# Patient Record
Sex: Female | Born: 1960 | Race: White | Hispanic: No | Marital: Married | Smoking: Never smoker
Health system: Southern US, Community
[De-identification: ages and names within clinical notes are randomized; demographics above are authoritative.]

## PROBLEM LIST (undated history)

## (undated) DIAGNOSIS — J45909 Unspecified asthma, uncomplicated: Secondary | ICD-10-CM

## (undated) DIAGNOSIS — G43909 Migraine, unspecified, not intractable, without status migrainosus: Secondary | ICD-10-CM

## (undated) HISTORY — DX: Unspecified asthma, uncomplicated: J45.909

## (undated) HISTORY — DX: Migraine, unspecified, not intractable, without status migrainosus: G43.909

---

## 2015-12-25 DIAGNOSIS — R5383 Other fatigue: Secondary | ICD-10-CM | POA: Diagnosis not present

## 2015-12-25 DIAGNOSIS — N951 Menopausal and female climacteric states: Secondary | ICD-10-CM | POA: Diagnosis not present

## 2015-12-25 DIAGNOSIS — R5381 Other malaise: Secondary | ICD-10-CM | POA: Diagnosis not present

## 2015-12-25 DIAGNOSIS — R635 Abnormal weight gain: Secondary | ICD-10-CM | POA: Diagnosis not present

## 2015-12-25 DIAGNOSIS — N6001 Solitary cyst of right breast: Secondary | ICD-10-CM | POA: Diagnosis not present

## 2015-12-29 DIAGNOSIS — Z1231 Encounter for screening mammogram for malignant neoplasm of breast: Secondary | ICD-10-CM | POA: Diagnosis not present

## 2016-02-04 DIAGNOSIS — H524 Presbyopia: Secondary | ICD-10-CM | POA: Diagnosis not present

## 2016-02-05 DIAGNOSIS — H5213 Myopia, bilateral: Secondary | ICD-10-CM | POA: Diagnosis not present

## 2016-07-20 DIAGNOSIS — G43709 Chronic migraine without aura, not intractable, without status migrainosus: Secondary | ICD-10-CM | POA: Insufficient documentation

## 2016-07-20 DIAGNOSIS — Z01419 Encounter for gynecological examination (general) (routine) without abnormal findings: Secondary | ICD-10-CM | POA: Diagnosis not present

## 2016-07-20 DIAGNOSIS — Z6823 Body mass index (BMI) 23.0-23.9, adult: Secondary | ICD-10-CM | POA: Diagnosis not present

## 2016-12-15 DIAGNOSIS — R51 Headache: Secondary | ICD-10-CM | POA: Diagnosis not present

## 2016-12-15 DIAGNOSIS — G43719 Chronic migraine without aura, intractable, without status migrainosus: Secondary | ICD-10-CM | POA: Diagnosis not present

## 2016-12-15 DIAGNOSIS — Z049 Encounter for examination and observation for unspecified reason: Secondary | ICD-10-CM | POA: Diagnosis not present

## 2016-12-15 DIAGNOSIS — Z79899 Other long term (current) drug therapy: Secondary | ICD-10-CM | POA: Diagnosis not present

## 2016-12-15 DIAGNOSIS — G43839 Menstrual migraine, intractable, without status migrainosus: Secondary | ICD-10-CM | POA: Diagnosis not present

## 2016-12-28 DIAGNOSIS — M791 Myalgia: Secondary | ICD-10-CM | POA: Diagnosis not present

## 2016-12-28 DIAGNOSIS — G43719 Chronic migraine without aura, intractable, without status migrainosus: Secondary | ICD-10-CM | POA: Diagnosis not present

## 2016-12-28 DIAGNOSIS — G518 Other disorders of facial nerve: Secondary | ICD-10-CM | POA: Diagnosis not present

## 2016-12-28 DIAGNOSIS — G43839 Menstrual migraine, intractable, without status migrainosus: Secondary | ICD-10-CM | POA: Diagnosis not present

## 2016-12-28 DIAGNOSIS — M542 Cervicalgia: Secondary | ICD-10-CM | POA: Diagnosis not present

## 2017-01-11 DIAGNOSIS — G43719 Chronic migraine without aura, intractable, without status migrainosus: Secondary | ICD-10-CM | POA: Diagnosis not present

## 2017-01-11 DIAGNOSIS — G43839 Menstrual migraine, intractable, without status migrainosus: Secondary | ICD-10-CM | POA: Diagnosis not present

## 2017-01-11 DIAGNOSIS — M542 Cervicalgia: Secondary | ICD-10-CM | POA: Diagnosis not present

## 2017-01-11 DIAGNOSIS — G518 Other disorders of facial nerve: Secondary | ICD-10-CM | POA: Diagnosis not present

## 2017-01-11 DIAGNOSIS — M791 Myalgia: Secondary | ICD-10-CM | POA: Diagnosis not present

## 2017-01-28 DIAGNOSIS — G43719 Chronic migraine without aura, intractable, without status migrainosus: Secondary | ICD-10-CM | POA: Diagnosis not present

## 2017-01-28 DIAGNOSIS — G43839 Menstrual migraine, intractable, without status migrainosus: Secondary | ICD-10-CM | POA: Diagnosis not present

## 2017-01-28 DIAGNOSIS — M791 Myalgia: Secondary | ICD-10-CM | POA: Diagnosis not present

## 2017-01-28 DIAGNOSIS — M542 Cervicalgia: Secondary | ICD-10-CM | POA: Diagnosis not present

## 2017-01-28 DIAGNOSIS — G518 Other disorders of facial nerve: Secondary | ICD-10-CM | POA: Diagnosis not present

## 2017-02-11 DIAGNOSIS — H524 Presbyopia: Secondary | ICD-10-CM | POA: Diagnosis not present

## 2017-02-11 DIAGNOSIS — H5213 Myopia, bilateral: Secondary | ICD-10-CM | POA: Diagnosis not present

## 2017-02-14 DIAGNOSIS — M791 Myalgia: Secondary | ICD-10-CM | POA: Diagnosis not present

## 2017-02-14 DIAGNOSIS — G518 Other disorders of facial nerve: Secondary | ICD-10-CM | POA: Diagnosis not present

## 2017-02-14 DIAGNOSIS — G43719 Chronic migraine without aura, intractable, without status migrainosus: Secondary | ICD-10-CM | POA: Diagnosis not present

## 2017-02-14 DIAGNOSIS — M542 Cervicalgia: Secondary | ICD-10-CM | POA: Diagnosis not present

## 2017-02-14 DIAGNOSIS — G43839 Menstrual migraine, intractable, without status migrainosus: Secondary | ICD-10-CM | POA: Diagnosis not present

## 2017-02-28 DIAGNOSIS — M542 Cervicalgia: Secondary | ICD-10-CM | POA: Diagnosis not present

## 2017-02-28 DIAGNOSIS — M791 Myalgia: Secondary | ICD-10-CM | POA: Diagnosis not present

## 2017-02-28 DIAGNOSIS — G43839 Menstrual migraine, intractable, without status migrainosus: Secondary | ICD-10-CM | POA: Diagnosis not present

## 2017-02-28 DIAGNOSIS — G518 Other disorders of facial nerve: Secondary | ICD-10-CM | POA: Diagnosis not present

## 2017-02-28 DIAGNOSIS — G43719 Chronic migraine without aura, intractable, without status migrainosus: Secondary | ICD-10-CM | POA: Diagnosis not present

## 2017-03-14 DIAGNOSIS — G43839 Menstrual migraine, intractable, without status migrainosus: Secondary | ICD-10-CM | POA: Diagnosis not present

## 2017-03-14 DIAGNOSIS — G43719 Chronic migraine without aura, intractable, without status migrainosus: Secondary | ICD-10-CM | POA: Diagnosis not present

## 2017-03-14 DIAGNOSIS — M791 Myalgia, unspecified site: Secondary | ICD-10-CM | POA: Diagnosis not present

## 2017-03-14 DIAGNOSIS — M542 Cervicalgia: Secondary | ICD-10-CM | POA: Diagnosis not present

## 2017-03-14 DIAGNOSIS — G518 Other disorders of facial nerve: Secondary | ICD-10-CM | POA: Diagnosis not present

## 2017-04-26 DIAGNOSIS — M542 Cervicalgia: Secondary | ICD-10-CM | POA: Diagnosis not present

## 2017-04-26 DIAGNOSIS — M791 Myalgia, unspecified site: Secondary | ICD-10-CM | POA: Diagnosis not present

## 2017-04-26 DIAGNOSIS — G43839 Menstrual migraine, intractable, without status migrainosus: Secondary | ICD-10-CM | POA: Diagnosis not present

## 2017-04-26 DIAGNOSIS — G43719 Chronic migraine without aura, intractable, without status migrainosus: Secondary | ICD-10-CM | POA: Diagnosis not present

## 2017-04-26 DIAGNOSIS — G518 Other disorders of facial nerve: Secondary | ICD-10-CM | POA: Diagnosis not present

## 2017-05-19 DIAGNOSIS — Z1231 Encounter for screening mammogram for malignant neoplasm of breast: Secondary | ICD-10-CM | POA: Diagnosis not present

## 2017-06-08 ENCOUNTER — Ambulatory Visit: Payer: BLUE CROSS/BLUE SHIELD | Admitting: Nurse Practitioner

## 2017-06-08 ENCOUNTER — Telehealth: Payer: Self-pay | Admitting: Nurse Practitioner

## 2017-06-08 ENCOUNTER — Encounter: Payer: Self-pay | Admitting: Nurse Practitioner

## 2017-06-08 VITALS — BP 100/80 | HR 55 | Temp 97.9°F | Ht 69.0 in | Wt 144.0 lb

## 2017-06-08 DIAGNOSIS — Z136 Encounter for screening for cardiovascular disorders: Secondary | ICD-10-CM

## 2017-06-08 DIAGNOSIS — Z0000 Encounter for general adult medical examination without abnormal findings: Secondary | ICD-10-CM

## 2017-06-08 DIAGNOSIS — Z13220 Encounter for screening for lipoid disorders: Secondary | ICD-10-CM

## 2017-06-08 DIAGNOSIS — Z1159 Encounter for screening for other viral diseases: Secondary | ICD-10-CM | POA: Diagnosis not present

## 2017-06-08 LAB — CBC
HCT: 40 % (ref 36.0–46.0)
HEMOGLOBIN: 13.1 g/dL (ref 12.0–15.0)
MCHC: 32.7 g/dL (ref 30.0–36.0)
MCV: 96.1 fl (ref 78.0–100.0)
PLATELETS: 163 10*3/uL (ref 150.0–400.0)
RBC: 4.17 Mil/uL (ref 3.87–5.11)
RDW: 14.2 % (ref 11.5–15.5)
WBC: 3.2 10*3/uL — AB (ref 4.0–10.5)

## 2017-06-08 LAB — TSH: TSH: 1.21 u[IU]/mL (ref 0.35–4.50)

## 2017-06-08 LAB — COMPREHENSIVE METABOLIC PANEL
ALK PHOS: 50 U/L (ref 39–117)
ALT: 22 U/L (ref 0–35)
AST: 23 U/L (ref 0–37)
Albumin: 4.2 g/dL (ref 3.5–5.2)
BUN: 24 mg/dL — AB (ref 6–23)
CALCIUM: 9.2 mg/dL (ref 8.4–10.5)
CHLORIDE: 103 meq/L (ref 96–112)
CO2: 28 mEq/L (ref 19–32)
Creatinine, Ser: 0.82 mg/dL (ref 0.40–1.20)
GFR: 75.98 mL/min (ref >60.00)
Glucose, Bld: 94 mg/dL (ref 70–99)
POTASSIUM: 4 meq/L (ref 3.5–5.1)
SODIUM: 139 meq/L (ref 135–145)
Total Bilirubin: 0.5 mg/dL (ref 0.2–1.2)
Total Protein: 7 g/dL (ref 6.0–8.3)

## 2017-06-08 LAB — LIPID PANEL
Cholesterol: 166 mg/dL (ref 0–200)
HDL: 71.1 mg/dL (ref >39.00)
LDL CALC: 68 mg/dL (ref 0–99)
NONHDL: 95.19
TRIGLYCERIDES: 135 mg/dL (ref 0.0–149.0)
Total CHOL/HDL Ratio: 2
VLDL: 27 mg/dL (ref 0.0–40.0)

## 2017-06-08 NOTE — Telephone Encounter (Signed)
Cologuard order  Order [id number] has been submitted

## 2017-06-08 NOTE — Patient Instructions (Addendum)
Will order Cologuard. You will be contacted with instructions.  Sign medical release form to get records from Dr. Lucianne Lei Fletcher's office in [place].  Normal lab results. You will be called once form is completed and signed.  Health Maintenance, Female Adopting a healthy lifestyle and getting preventive care can go a long way to promote health and wellness. Talk with your health care provider about what schedule of regular examinations is right for you. This is a good chance for you to check in with your provider about disease prevention and staying healthy. In between checkups, there are plenty of things you can do on your own. Experts have done a lot of research about which lifestyle changes and preventive measures are most likely to keep you healthy. Ask your health care provider for more information. Weight and diet Eat a healthy diet  Be sure to include plenty of vegetables, fruits, low-fat dairy products, and lean protein.  Do not eat a lot of foods high in solid fats, added sugars, or salt.  Get regular exercise. This is one of the most important things you can do for your health. ? Most adults should exercise for at least 150 minutes each week. The exercise should increase your heart rate and make you sweat (moderate-intensity exercise). ? Most adults should also do strengthening exercises at least twice a week. This is in addition to the moderate-intensity exercise.  Maintain a healthy weight  Body mass index (BMI) is a measurement that can be used to identify possible weight problems. It estimates body fat based on height and weight. Your health care provider can help determine your BMI and help you achieve or maintain a healthy weight.  For females 71 years of age and older: ? A BMI below 18.5 is considered underweight. ? A BMI of 18.5 to 24.9 is normal. ? A BMI of 25 to 29.9 is considered overweight. ? A BMI of 30 and above is considered obese.  Watch levels of  cholesterol and blood lipids  You should start having your blood tested for lipids and cholesterol at 59 years of age, then have this test every 5 years.  You may need to have your cholesterol levels checked more often if: ? Your lipid or cholesterol levels are high. ? You are older than 59 years of age. ? You are at high risk for heart disease.  Cancer screening Lung Cancer  Lung cancer screening is recommended for adults 55-79 years old who are at high risk for lung cancer because of a history of smoking.  A yearly low-dose CT scan of the lungs is recommended for people who: ? Currently smoke. ? Have quit within the past 15 years. ? Have at least a 30-pack-year history of smoking. A pack year is smoking an average of one pack of cigarettes a day for 1 year.  Yearly screening should continue until it has been 15 years since you quit.  Yearly screening should stop if you develop a health problem that would prevent you from having lung cancer treatment.  Breast Cancer  Practice breast self-awareness. This means understanding how your breasts normally appear and feel.  It also means doing regular breast self-exams. Let your health care provider know about any changes, no matter how small.  If you are in your 20s or 30s, you should have a clinical breast exam (CBE) by a health care provider every 1-3 years as part of a regular health exam.  If you are 40 or older,  have a CBE every year. Also consider having a breast X-ray (mammogram) every year.  If you have a family history of breast cancer, talk to your health care provider about genetic screening.  If you are at high risk for breast cancer, talk to your health care provider about having an MRI and a mammogram every year.  Breast cancer gene (BRCA) assessment is recommended for women who have family members with BRCA-related cancers. BRCA-related cancers include: ? Breast. ? Ovarian. ? Tubal. ? Peritoneal cancers.  Results  of the assessment will determine the need for genetic counseling and BRCA1 and BRCA2 testing.  Cervical Cancer Your health care provider may recommend that you be screened regularly for cancer of the pelvic organs (ovaries, uterus, and vagina). This screening involves a pelvic examination, including checking for microscopic changes to the surface of your cervix (Pap test). You may be encouraged to have this screening done every 3 years, beginning at age 72.  For women ages 59-65, health care providers may recommend pelvic exams and Pap testing every 3 years, or they may recommend the Pap and pelvic exam, combined with testing for human papilloma virus (HPV), every 5 years. Some types of HPV increase your risk of cervical cancer. Testing for HPV may also be done on women of any age with unclear Pap test results.  Other health care providers may not recommend any screening for nonpregnant women who are considered low risk for pelvic cancer and who do not have symptoms. Ask your health care provider if a screening pelvic exam is right for you.  If you have had past treatment for cervical cancer or a condition that could lead to cancer, you need Pap tests and screening for cancer for at least 20 years after your treatment. If Pap tests have been discontinued, your risk factors (such as having a new sexual partner) need to be reassessed to determine if screening should resume. Some women have medical problems that increase the chance of getting cervical cancer. In these cases, your health care provider may recommend more frequent screening and Pap tests.  Colorectal Cancer  This type of cancer can be detected and often prevented.  Routine colorectal cancer screening usually begins at 59 years of age and continues through 59 years of age.  Your health care provider may recommend screening at an earlier age if you have risk factors for colon cancer.  Your health care provider may also recommend using  home test kits to check for hidden blood in the stool.  A small camera at the end of a tube can be used to examine your colon directly (sigmoidoscopy or colonoscopy). This is done to check for the earliest forms of colorectal cancer.  Routine screening usually begins at age 71.  Direct examination of the colon should be repeated every 5-10 years through 59 years of age. However, you may need to be screened more often if early forms of precancerous polyps or small growths are found.  Skin Cancer  Check your skin from head to toe regularly.  Tell your health care provider about any new moles or changes in moles, especially if there is a change in a mole's shape or color.  Also tell your health care provider if you have a mole that is larger than the size of a pencil eraser.  Always use sunscreen. Apply sunscreen liberally and repeatedly throughout the day.  Protect yourself by wearing long sleeves, pants, a wide-brimmed hat, and sunglasses whenever you are outside.  Heart disease, diabetes, and high blood pressure  High blood pressure causes heart disease and increases the risk of stroke. High blood pressure is more likely to develop in: ? People who have blood pressure in the high end of the normal range (130-139/85-89 mm Hg). ? People who are overweight or obese. ? People who are African American.  If you are 13-66 years of age, have your blood pressure checked every 3-5 years. If you are 65 years of age or older, have your blood pressure checked every year. You should have your blood pressure measured twice-once when you are at a hospital or clinic, and once when you are not at a hospital or clinic. Record the average of the two measurements. To check your blood pressure when you are not at a hospital or clinic, you can use: ? An automated blood pressure machine at a pharmacy. ? A home blood pressure monitor.  If you are between 3 years and 60 years old, ask your health care provider  if you should take aspirin to prevent strokes.  Have regular diabetes screenings. This involves taking a blood sample to check your fasting blood sugar level. ? If you are at a normal weight and have a low risk for diabetes, have this test once every three years after 59 years of age. ? If you are overweight and have a high risk for diabetes, consider being tested at a younger age or more often. Preventing infection Hepatitis B  If you have a higher risk for hepatitis B, you should be screened for this virus. You are considered at high risk for hepatitis B if: ? You were born in a country where hepatitis B is common. Ask your health care provider which countries are considered high risk. ? Your parents were born in a high-risk country, and you have not been immunized against hepatitis B (hepatitis B vaccine). ? You have HIV or AIDS. ? You use needles to inject street drugs. ? You live with someone who has hepatitis B. ? You have had sex with someone who has hepatitis B. ? You get hemodialysis treatment. ? You take certain medicines for conditions, including cancer, organ transplantation, and autoimmune conditions.  Hepatitis C  Blood testing is recommended for: ? Everyone born from [birth year] through 1967. ? Anyone with known risk factors for hepatitis C.  Sexually transmitted infections (STIs)  You should be screened for sexually transmitted infections (STIs) including gonorrhea and chlamydia if: ? You are sexually active and are younger than 59 years of age. ? You are older than 59 years of age and your health care provider tells you that you are at risk for this type of infection. ? Your sexual activity has changed since you were last screened and you are at an increased risk for chlamydia or gonorrhea. Ask your health care provider if you are at risk.  If you do not have HIV, but are at risk, it may be recommended that you take a prescription medicine daily to prevent HIV infection. This  is called pre-exposure prophylaxis (PrEP). You are considered at risk if: ? You are sexually active and do not regularly use condoms or know the HIV status of your partner(s). ? You take drugs by injection. ? You are sexually active with a partner who has HIV.  Talk with your health care provider about whether you are at high risk of being infected with HIV. If you choose to begin PrEP, you should first be tested for HIV.  You should then be tested every 3 months for as long as you are taking PrEP. Pregnancy  If you are premenopausal and you may become pregnant, ask your health care provider about preconception counseling.  If you may become pregnant, take 400 to 800 micrograms (mcg) of folic acid every day.  If you want to prevent pregnancy, talk to your health care provider about birth control (contraception). Osteoporosis and menopause  Osteoporosis is a disease in which the bones lose minerals and strength with aging. This can result in serious bone fractures. Your risk for osteoporosis can be identified using a bone density scan.  If you are 27 years of age or older, or if you are at risk for osteoporosis and fractures, ask your health care provider if you should be screened.  Ask your health care provider whether you should take a calcium or vitamin D supplement to lower your risk for osteoporosis.  Menopause may have certain physical symptoms and risks.  Hormone replacement therapy may reduce some of these symptoms and risks. Talk to your health care provider about whether hormone replacement therapy is right for you. Follow these instructions at home:  Schedule regular health, dental, and eye exams.  Stay current with your immunizations.  Do not use any tobacco products including cigarettes, chewing tobacco, or electronic cigarettes.  If you are pregnant, do not drink alcohol.  If you are breastfeeding, limit how much and how often you drink alcohol.  Limit alcohol intake  to no more than 1 drink per day for nonpregnant women. One drink equals 12 ounces of beer, 5 ounces of wine, or 1 ounces of hard liquor.  Do not use street drugs.  Do not share needles.  Ask your health care provider for help if you need support or information about quitting drugs.  Tell your health care provider if you often feel depressed.  Tell your health care provider if you have ever been abused or do not feel safe at home. This information is not intended to replace advice given to you by your health care provider. Make sure you discuss any questions you have with your health care provider. Document Released: 12/07/2010 Document Revised: 10/30/2015 Document Reviewed: 02/25/2015 Elsevier Interactive Patient Education  [employer].

## 2017-06-08 NOTE — Progress Notes (Signed)
Subjective:    Patient ID: Nancy Burns, female    DOB: September 08, 1960, 57 y.o.   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 Therefore needs form completed. Reports she is up to date with immunization.  No previous pcp in last 78yrs.  Hx of Migraines: Man[facility] by Dr. Trudie [facility]ler [facility] [place]. Migraines manages through avoiding triggers (food addictive), prn medication (imitrex and baclofen), and trigger injections (last done 6weeks ago).  Immunizations: (TDAP, Hep C screen, Pneumovax, Influenza, zoster)  Health Maintenance  Topic Date Due  . HIV Screening  12/21/1973  . Tetanus Vaccine  12/21/1977  . Pap Smear  12/22/1979  . Mammogram  12/21/2008  . Colon Cancer Screening  12/21/2008  . Flu Shot  02/08/2018*  .  Hepatitis C: One time screening is recommended by Center for Disease Control  (CDC) for  adults born from [birth year] through 1967.   Completed  *Topic was postponed. The date shown is not the original due date.   Diet: heart healthy, high fiber.  Weight:  Wt Readings from Last 3 Encounters:  06/08/17 144 lb (65.3 kg)    Exercise:walking 2-81miles  5days a week.  Fall Risk: Fall Risk  06/08/2017  Falls in the past year? No   Home Safety:home with husband.  Depression/Suicide: Depression screen [facility] 2/9 06/08/2017  Decreased Interest 0  Down, Depressed, Hopeless 0  PHQ - 2 Score 0   No flowsheet data found. Colonoscopy (every 5-49yrs, >50-25yrs):needed. Wants cologuard ordered  Pap Smear (every 79yrs for >21-29 without HPV, every 63yrs for >30-64yrs with HPV):up to date, done by Dr. Rana Snare with [facility], normal per patient. Mammogram (yearly, >54yrs):up to date  Vision: up to date, done by Dr. Hazle Quant  Dental:up to date, done by Dr. Lilian Kapur  Medications and allergies reviewed with patient and updated if appropriate.  There are no active problems to  display for this patient.   Current Outpatient Medications on File Prior to Visit  Medication Sig Dispense Refill  . baclofen (LIORESAL) 10 MG tablet Take 10 mg by mouth 3 (three) times daily.    . SUMAtriptan (IMITREX) 100 MG tablet Take 100 mg by mouth every 2 (two) hours as needed for migraine. May repeat in 2 hours if headache persists or recurs.     No current facility-administered medications on file prior to visit.     Past Medical History:  Diagnosis Date  . Migraines     Social History   Socioeconomic History  . Marital status: Married    Spouse name: None  . Number of children: 2  . Years of education: None  . Highest education level: None  Social Needs  . Financial resource strain: None  . Food insecurity - worry: None  . Food insecurity - inability: None  . Transportation needs - medical: None  . Transportation needs - non-medical: None  Occupational History  . None  Tobacco Use  . Smoking status: Never Smoker  . Smokeless tobacco: Never Used  Substance and Sexual Activity  . Alcohol use: Yes    Comment: social  . Drug use: No  . Sexual activity: Yes  Other Topics Concern  . None  Social History Narrative  . None    Family History  Problem Relation Age of Onset  . Diabetes Father   . Heart disease Father   . Hypertension Father   . Hypertension Brother   .  Heart disease Mother   . Osteoporosis Sister         Review of Systems  Constitutional: Negative for fever, malaise/fatigue and weight loss.  HENT: Negative for congestion and sore throat.   Eyes:       Negative for visual changes  Respiratory: Negative for cough and shortness of breath.   Cardiovascular: Negative for chest pain, palpitations and leg swelling.  Gastrointestinal: Negative for blood in stool, constipation, diarrhea and heartburn.  Genitourinary: Negative for dysuria, frequency and urgency.  Musculoskeletal: Negative for falls, joint pain and myalgias.  Skin: Negative for  rash.  Neurological: Positive for headaches. Negative for dizziness and sensory change.  Endo/Heme/Allergies: Does not bruise/bleed easily.  Psychiatric/Behavioral: Negative for depression, substance abuse and suicidal ideas. The patient is not nervous/anxious.     Objective:   Vitals:   06/08/17 0931  BP: 100/80  Pulse: (!) 55  Temp: 97.9 F (36.6 C)  SpO2: 99%    Body mass index is 21.27 kg/m.   Physical Examination:  Physical Exam  Constitutional: She is oriented to person, place, and time and well-developed, well-nourished, and in no distress. No distress.  HENT:  Right Ear: External ear normal.  Left Ear: External ear normal.  Nose: Nose normal.  Mouth/Throat: Oropharynx is clear and moist. No oropharyngeal exudate.  Eyes: Conjunctivae and EOM are normal. Pupils are equal, round, and reactive to light. No scleral icterus.  Neck: Normal range of motion. Neck supple. No thyromegaly present.  Cardiovascular: Normal rate, normal heart sounds and intact distal pulses.  Pulmonary/Chest: Effort normal and breath sounds normal. She exhibits no tenderness.  Abdominal: Soft. Bowel sounds are normal. She exhibits no distension. There is no tenderness.  Genitourinary:  Genitourinary Comments: Breast and pelvic exam deferred to GYN per patient.  Musculoskeletal: Normal range of motion. She exhibits no edema or tenderness.  Lymphadenopathy:    She has no cervical adenopathy.  Neurological: She is alert and oriented to person, place, and time. Gait normal.  Skin: Skin is warm and dry.  Psychiatric: Affect and judgment normal.    ASSESSMENT and PLAN:  Nancy Burns was seen today for establish care.  Diagnoses and all orders for this visit:  Preventative health care -     Comprehensive metabolic panel -     TSH -     CBC -     Lipid panel -     Cancel: Hepatitis C Antibody -     Hepatitis C Antibody  Encounter for lipid screening for cardiovascular disease -     Lipid  panel  Encounter for hepatitis C screening test for low risk patient -     Cancel: Hepatitis C Antibody -     Hepatitis C Antibody   No problem-specific Assessment & Plan notes found for this encounter.     Follow up: Return if symptoms worsen or fail to improve.  Alysia Pennaharlotte Nche, NP

## 2017-06-09 ENCOUNTER — Encounter: Payer: BLUE CROSS/BLUE SHIELD | Admitting: Behavioral Health

## 2017-06-09 LAB — HEPATITIS C ANTIBODY
Hepatitis C Ab: NONREACTIVE
SIGNAL TO CUT-OFF: 0.01 (ref <1.00)

## 2017-06-13 ENCOUNTER — Encounter: Payer: Self-pay | Admitting: Nurse Practitioner

## 2017-06-14 ENCOUNTER — Ambulatory Visit (INDEPENDENT_AMBULATORY_CARE_PROVIDER_SITE_OTHER): Payer: BLUE CROSS/BLUE SHIELD | Admitting: Behavioral Health

## 2017-06-14 DIAGNOSIS — Z111 Encounter for screening for respiratory tuberculosis: Secondary | ICD-10-CM | POA: Diagnosis not present

## 2017-06-14 NOTE — Progress Notes (Signed)
PPD Placement note/Provider approved PPD skin test Elane FritzJandra Omahoney, 57 y.o. female is here today for placement of PPD test Reason for PPD test: Work Pt taken PPD test before: yes Verified in allergy area and with patient that they are not allergic to the products PPD is made of (Phenol or Tween). Yes Is patient taking any oral or IV steroid medication now or have they taken it in the last month? no Has the patient ever received the BCG vaccine?: no Has the patient been in recent contact with anyone known or suspected of having active TB disease?: no      P:  PPD placed on 06/14/2017. Left forearm/NDC: 45409-811-91/[id number]/Lot: [id number]/Exp: 4.05.2021/Manf: [employer]  Patient advised to return for reading within 48-72 hours.

## 2017-06-15 DIAGNOSIS — G518 Other disorders of facial nerve: Secondary | ICD-10-CM | POA: Diagnosis not present

## 2017-06-15 DIAGNOSIS — M791 Myalgia, unspecified site: Secondary | ICD-10-CM | POA: Diagnosis not present

## 2017-06-15 DIAGNOSIS — G43839 Menstrual migraine, intractable, without status migrainosus: Secondary | ICD-10-CM | POA: Diagnosis not present

## 2017-06-15 DIAGNOSIS — G43719 Chronic migraine without aura, intractable, without status migrainosus: Secondary | ICD-10-CM | POA: Diagnosis not present

## 2017-06-15 DIAGNOSIS — M542 Cervicalgia: Secondary | ICD-10-CM | POA: Diagnosis not present

## 2017-06-16 ENCOUNTER — Ambulatory Visit: Payer: BLUE CROSS/BLUE SHIELD | Admitting: Behavioral Health

## 2017-06-16 DIAGNOSIS — Z111 Encounter for screening for respiratory tuberculosis: Secondary | ICD-10-CM

## 2017-06-16 LAB — TB SKIN TEST
INDURATION: 0 mm
TB Skin Test: NEGATIVE

## 2017-06-17 NOTE — Progress Notes (Signed)
Patient came in clinic for PPD reading within 48-72 hours of placement. PPD was placed on 06/14/17; see clinical support note. Results were negative; 0 mm induration. No chest x-ray was required. Completed form & letter given to patient for employment.

## 2017-06-20 DIAGNOSIS — Z1212 Encounter for screening for malignant neoplasm of rectum: Secondary | ICD-10-CM | POA: Diagnosis not present

## 2017-06-20 DIAGNOSIS — Z1211 Encounter for screening for malignant neoplasm of colon: Secondary | ICD-10-CM | POA: Diagnosis not present

## 2017-06-21 LAB — COLOGUARD: Cologuard: NEGATIVE

## 2017-07-08 DIAGNOSIS — N6320 Unspecified lump in the left breast, unspecified quadrant: Secondary | ICD-10-CM | POA: Diagnosis not present

## 2017-07-12 ENCOUNTER — Other Ambulatory Visit: Payer: Self-pay | Admitting: Obstetrics and Gynecology

## 2017-07-12 DIAGNOSIS — N6320 Unspecified lump in the left breast, unspecified quadrant: Secondary | ICD-10-CM

## 2017-07-19 ENCOUNTER — Telehealth: Payer: Self-pay | Admitting: Family Medicine

## 2017-07-19 ENCOUNTER — Ambulatory Visit
Admission: RE | Admit: 2017-07-19 | Discharge: 2017-07-19 | Disposition: A | Discharge status: Home / Self Care | Payer: BLUE CROSS/BLUE SHIELD | Source: Ambulatory Visit | Attending: Obstetrics and Gynecology | Admitting: Obstetrics and Gynecology

## 2017-07-19 ENCOUNTER — Other Ambulatory Visit: Payer: Self-pay | Admitting: Obstetrics and Gynecology

## 2017-07-19 DIAGNOSIS — N6320 Unspecified lump in the left breast, unspecified quadrant: Secondary | ICD-10-CM

## 2017-07-19 DIAGNOSIS — N6489 Other specified disorders of breast: Secondary | ICD-10-CM | POA: Diagnosis not present

## 2017-07-19 DIAGNOSIS — R922 Inconclusive mammogram: Secondary | ICD-10-CM | POA: Diagnosis not present

## 2017-07-19 NOTE — Telephone Encounter (Signed)
Copied from CRM [phone]. Topic: Quick Communication - See Telephone Encounter >> Jul 19, 2017  4:15 PM Cipriano BunkerLambe, Annette S wrote:  CRM for notification. See Telephone encounter for:   Pt. Called spoke to the people from [employer] and they told her results were sent to doctors portal.   Please cal with results  07/19/17.

## 2017-07-20 NOTE — Telephone Encounter (Signed)
I faxed the company requesting a copy to be faxed to us ASAP as we have yet to see results and patient was advised that they were in/thx dmf

## 2017-07-26 ENCOUNTER — Encounter: Payer: Self-pay | Admitting: Nurse Practitioner

## 2017-07-26 NOTE — Progress Notes (Signed)
Abstracted result and sent to scan  

## 2017-07-27 DIAGNOSIS — G518 Other disorders of facial nerve: Secondary | ICD-10-CM | POA: Diagnosis not present

## 2017-07-27 DIAGNOSIS — M791 Myalgia, unspecified site: Secondary | ICD-10-CM | POA: Diagnosis not present

## 2017-07-27 DIAGNOSIS — M542 Cervicalgia: Secondary | ICD-10-CM | POA: Diagnosis not present

## 2017-07-27 DIAGNOSIS — G43719 Chronic migraine without aura, intractable, without status migrainosus: Secondary | ICD-10-CM | POA: Diagnosis not present

## 2017-07-27 DIAGNOSIS — G43839 Menstrual migraine, intractable, without status migrainosus: Secondary | ICD-10-CM | POA: Diagnosis not present

## 2017-07-27 NOTE — Telephone Encounter (Signed)
Left detail massage inform pt of the result: negative.

## 2017-08-08 DIAGNOSIS — L570 Actinic keratosis: Secondary | ICD-10-CM | POA: Diagnosis not present

## 2017-08-08 DIAGNOSIS — D1801 Hemangioma of skin and subcutaneous tissue: Secondary | ICD-10-CM | POA: Diagnosis not present

## 2017-08-08 DIAGNOSIS — L814 Other melanin hyperpigmentation: Secondary | ICD-10-CM | POA: Diagnosis not present

## 2017-08-08 DIAGNOSIS — L821 Other seborrheic keratosis: Secondary | ICD-10-CM | POA: Diagnosis not present

## 2017-08-10 DIAGNOSIS — N6459 Other signs and symptoms in breast: Secondary | ICD-10-CM | POA: Diagnosis not present

## 2017-09-05 DIAGNOSIS — Z01419 Encounter for gynecological examination (general) (routine) without abnormal findings: Secondary | ICD-10-CM | POA: Diagnosis not present

## 2017-09-05 DIAGNOSIS — Z6821 Body mass index (BMI) 21.0-21.9, adult: Secondary | ICD-10-CM | POA: Diagnosis not present

## 2017-09-07 DIAGNOSIS — M791 Myalgia, unspecified site: Secondary | ICD-10-CM | POA: Diagnosis not present

## 2017-09-07 DIAGNOSIS — G43719 Chronic migraine without aura, intractable, without status migrainosus: Secondary | ICD-10-CM | POA: Diagnosis not present

## 2017-09-07 DIAGNOSIS — G518 Other disorders of facial nerve: Secondary | ICD-10-CM | POA: Diagnosis not present

## 2017-09-07 DIAGNOSIS — G43839 Menstrual migraine, intractable, without status migrainosus: Secondary | ICD-10-CM | POA: Diagnosis not present

## 2017-09-07 DIAGNOSIS — M542 Cervicalgia: Secondary | ICD-10-CM | POA: Diagnosis not present

## 2017-09-19 DIAGNOSIS — N6320 Unspecified lump in the left breast, unspecified quadrant: Secondary | ICD-10-CM | POA: Diagnosis not present

## 2017-10-10 DIAGNOSIS — G43839 Menstrual migraine, intractable, without status migrainosus: Secondary | ICD-10-CM | POA: Diagnosis not present

## 2017-10-10 DIAGNOSIS — M542 Cervicalgia: Secondary | ICD-10-CM | POA: Diagnosis not present

## 2017-10-10 DIAGNOSIS — G509 Disorder of trigeminal nerve, unspecified: Secondary | ICD-10-CM | POA: Diagnosis not present

## 2017-10-10 DIAGNOSIS — G500 Trigeminal neuralgia: Secondary | ICD-10-CM | POA: Diagnosis not present

## 2017-10-10 DIAGNOSIS — G518 Other disorders of facial nerve: Secondary | ICD-10-CM | POA: Diagnosis not present

## 2017-10-10 DIAGNOSIS — M791 Myalgia, unspecified site: Secondary | ICD-10-CM | POA: Diagnosis not present

## 2017-10-10 DIAGNOSIS — G43719 Chronic migraine without aura, intractable, without status migrainosus: Secondary | ICD-10-CM | POA: Diagnosis not present

## 2017-10-19 ENCOUNTER — Ambulatory Visit: Payer: BLUE CROSS/BLUE SHIELD | Admitting: Nurse Practitioner

## 2017-10-19 VITALS — BP 98/68 | HR 51 | Temp 97.5°F | Ht 69.0 in | Wt 140.6 lb

## 2017-10-19 DIAGNOSIS — S30860A Insect bite (nonvenomous) of lower back and pelvis, initial encounter: Secondary | ICD-10-CM

## 2017-10-19 DIAGNOSIS — W57XXXA Bitten or stung by nonvenomous insect and other nonvenomous arthropods, initial encounter: Secondary | ICD-10-CM | POA: Diagnosis not present

## 2017-10-19 NOTE — Progress Notes (Signed)
Subjective:  Patient ID: Nancy Burns, female    DOB: 04/24/1961  Age: 57 y.o. MRN: [id number]  CC: Tick Removal (4.28.19  found on 4.30.19 on lower left back. little itch and redness. unaware of any other symptoms, just want to get spiot checked.)  Rash  This is a new problem. The current episode started 1 to 4 weeks ago. The problem is unchanged. The affected locations include the back. The rash is characterized by pain, itchiness, redness and swelling. She was exposed to an insect bite/sting (tick bite 10/02/17, removed 10/04/17). Pertinent negatives include no anorexia, congestion, diarrhea, facial edema, fatigue, fever, joint pain or shortness of breath. Past treatments include anti-itch cream and antibiotic cream. The treatment provided no relief.   Does not want to take oral abx unless she has to. Will like lyme and RMSF test done.  Outpatient Medications Prior to Visit  Medication Sig Dispense Refill  . baclofen (LIORESAL) 10 MG tablet Take 10 mg by mouth 3 (three) times daily.    Marland Kitchen MAGNESIUM PO Take by mouth.    . SUMAtriptan (IMITREX) 100 MG tablet Take 100 mg by mouth every 2 (two) hours as needed for migraine. May repeat in 2 hours if headache persists or recurs.     No facility-administered medications prior to visit.     ROS See HPI  Objective:  BP 98/68 (BP Location: Left Arm, Patient Position: Sitting, Cuff Size: Normal)   Pulse (!) 51   Temp (!) 97.5 F (36.4 C) (Oral)   Ht  (1.753 m)   Wt 140 lb 9.6 oz (63.8 kg)   SpO2 96%   BMI 20.76 kg/m   BP Readings from Last 3 Encounters:  10/19/17 98/68  06/08/17 100/80    Wt Readings from Last 3 Encounters:  10/19/17 140 lb 9.6 oz (63.8 kg)  06/08/17 144 lb (65.3 kg)    Physical Exam  Constitutional: No distress.  Cardiovascular: Normal rate.  Pulmonary/Chest: Effort normal.  Skin: Skin is warm. Rash noted. Rash is papular. There is erythema.     No tick remnants  Psychiatric: She has a normal mood  and affect. Her behavior is normal.  Vitals reviewed.   Lab Results  Component Value Date   WBC 3.2 (L) 06/08/2017   HGB 13.1 06/08/2017   HCT 40.0 06/08/2017   PLT 163.0 06/08/2017   GLUCOSE 94 06/08/2017   CHOL 166 06/08/2017   TRIG 135.0 06/08/2017   HDL 71.10 06/08/2017   LDLCALC 68 06/08/2017   ALT 22 06/08/2017   AST 23 06/08/2017   NA 139 06/08/2017   K 4.0 06/08/2017   CL 103 06/08/2017   CREATININE 0.82 06/08/2017   BUN 24 (H) 06/08/2017   CO2 28 06/08/2017   TSH 1.21 06/08/2017    US Breast Ltd Uni Left Inc Axilla  Result Date: 07/19/2017 CLINICAL DATA:  57 year old female with a palpable left breast abnormality for many years. Patient had an episode of associated pain and tenderness to this region approximately 2 weeks ago with associated swelling of left axillary lymph nodes and left arm tingling. Symptoms have since improved. EXAM: 2D DIGITAL DIAGNOSTIC LEFT MAMMOGRAM WITH CAD AND ADJUNCT TOMO ULTRASOUND LEFT BREAST COMPARISON:  Previous exam(s). ACR Breast Density Category d: The breast tissue is extremely dense, which lowers the sensitivity of mammography. FINDINGS: An oval, circumscribed isodense mass is seen deep to the radiopaque BB at the site of the patient's palpable abnormality on tangential views. No suspicious mammographic findings are  identified within the left axilla. Mammographic images were processed with CAD. On physical exam, I palpate a smooth circumscribed 1 mm mobile mass within the inferior left breast. Targeted ultrasound is performed, showing an oval, circumscribed hypoechoic mass at the 5 o'clock position 4 cm from the nipple. It measures 1 x 0.9 x 0.4 cm. Note is made of internal and peripheral vascularity. This corresponds with the patient's stable, palpable abnormality. A similar appearing 0.5 x 0.5 x 0.4 cm mass is noted at the 5 o'clock position 1 cm from the nipple. IMPRESSION: 1. 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 Patient has felt the larger of these areas for many years, without significant change in size. Recommendation is for six-month ultrasound follow-up of both masses to document imaging stability. No suspicious mammographic or sonographic findings are identified, corresponding with the patient's improving tenderness in this region. 2. No suspicious left axillary findings corresponding with the patient's clinical symptoms. RECOMMENDATION: Left breast ultrasound in 6 months. I have discussed the findings and recommendations with the patient. Results were also provided in writing at the conclusion of the visit. If applicable, a reminder letter will be sent to the patient regarding the next appointment. BI-RADS CATEGORY  3: Probably benign. Electronically Signed   By: Sande Brothers M.D.   On: 07/19/2017 11:51   Mm Diag Breast Tomo Uni Left  Result Date: 07/19/2017 CLINICAL DATA:  57 year old female with a palpable left breast abnormality for many years. Patient had an episode of associated pain and tenderness to this region approximately 2 weeks ago with associated swelling of left axillary lymph nodes and left arm tingling. Symptoms have since improved. EXAM: 2D DIGITAL DIAGNOSTIC LEFT MAMMOGRAM WITH CAD AND ADJUNCT TOMO ULTRASOUND LEFT BREAST COMPARISON:  Previous exam(s). ACR Breast Density Category d: The breast tissue is extremely dense, which lowers the sensitivity of mammography. FINDINGS: An oval, circumscribed isodense mass is seen deep to the radiopaque BB at the site of the patient's palpable abnormality on tangential views. No suspicious mammographic findings are identified within the left axilla. Mammographic images were processed with CAD. On physical exam, I palpate a smooth circumscribed 1 mm mobile mass within the inferior left breast. Targeted ultrasound is performed, showing an oval, circumscribed hypoechoic mass at the 5 o'clock position 4 cm from the nipple. It measures 1 x 0.9 x 0.4  cm. Note is made of internal and peripheral vascularity. This corresponds with the patient's stable, palpable abnormality. A similar appearing 0.5 x 0.5 x 0.4 cm mass is noted at the 5 o'clock position 1 cm from the nipple. IMPRESSION: 1. 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 Patient has felt the larger of these areas for many years, without significant change in size. Recommendation is for six-month ultrasound follow-up of both masses to document imaging stability. No suspicious mammographic or sonographic findings are identified, corresponding with the patient's improving tenderness in this region. 2. No suspicious left axillary findings corresponding with the patient's clinical symptoms. RECOMMENDATION: Left breast ultrasound in 6 months. I have discussed the findings and recommendations with the patient. Results were also provided in writing at the conclusion of the visit. If applicable, a reminder letter will be sent to the patient regarding the next appointment. BI-RADS CATEGORY  3: Probably benign. Electronically Signed   By: Sande Brothers M.D.   On: 07/19/2017 11:51    Assessment & Plan:   Macel was seen today for tick removal.  Diagnoses and all orders  for this visit:  Tick bite of lower back, initial encounter -     Lyme Disease, IgM, Early Test w/ Rflx -     Rocky mtn spotted fvr ab, IgM-blood   I am having Nancy Burns maintain her baclofen, SUMAtriptan, and MAGNESIUM PO.  No orders of the defined types were placed in this encounter.   Follow-up: No follow-ups on file.  Alysia Penna, NP

## 2017-10-19 NOTE — Patient Instructions (Signed)

## 2017-10-20 ENCOUNTER — Encounter: Payer: Self-pay | Admitting: Nurse Practitioner

## 2017-10-21 LAB — LYME, IGM, EARLY TEST/REFLEX

## 2017-10-21 LAB — ROCKY MTN SPOTTED FVR AB, IGM-BLOOD: RMSF IGM: 0.29 {index} (ref 0.00–0.89)

## 2017-12-01 DIAGNOSIS — M791 Myalgia, unspecified site: Secondary | ICD-10-CM | POA: Diagnosis not present

## 2017-12-01 DIAGNOSIS — M542 Cervicalgia: Secondary | ICD-10-CM | POA: Diagnosis not present

## 2017-12-01 DIAGNOSIS — G43839 Menstrual migraine, intractable, without status migrainosus: Secondary | ICD-10-CM | POA: Diagnosis not present

## 2017-12-01 DIAGNOSIS — G518 Other disorders of facial nerve: Secondary | ICD-10-CM | POA: Diagnosis not present

## 2017-12-01 DIAGNOSIS — G43719 Chronic migraine without aura, intractable, without status migrainosus: Secondary | ICD-10-CM | POA: Diagnosis not present

## 2018-01-06 DIAGNOSIS — G43719 Chronic migraine without aura, intractable, without status migrainosus: Secondary | ICD-10-CM | POA: Diagnosis not present

## 2018-01-06 DIAGNOSIS — G43839 Menstrual migraine, intractable, without status migrainosus: Secondary | ICD-10-CM | POA: Diagnosis not present

## 2018-01-06 DIAGNOSIS — G518 Other disorders of facial nerve: Secondary | ICD-10-CM | POA: Diagnosis not present

## 2018-01-06 DIAGNOSIS — M542 Cervicalgia: Secondary | ICD-10-CM | POA: Diagnosis not present

## 2018-01-06 DIAGNOSIS — M791 Myalgia, unspecified site: Secondary | ICD-10-CM | POA: Diagnosis not present

## 2018-01-18 ENCOUNTER — Other Ambulatory Visit: Payer: Self-pay | Admitting: Obstetrics and Gynecology

## 2018-01-18 ENCOUNTER — Ambulatory Visit
Admission: RE | Admit: 2018-01-18 | Discharge: 2018-01-18 | Disposition: A | Discharge status: Home / Self Care | Payer: BLUE CROSS/BLUE SHIELD | Source: Ambulatory Visit | Attending: Obstetrics and Gynecology | Admitting: Obstetrics and Gynecology

## 2018-01-18 DIAGNOSIS — N6320 Unspecified lump in the left breast, unspecified quadrant: Secondary | ICD-10-CM

## 2018-01-18 DIAGNOSIS — D242 Benign neoplasm of left breast: Secondary | ICD-10-CM | POA: Diagnosis not present

## 2018-01-18 DIAGNOSIS — N6489 Other specified disorders of breast: Secondary | ICD-10-CM | POA: Diagnosis not present

## 2018-01-25 DIAGNOSIS — G43719 Chronic migraine without aura, intractable, without status migrainosus: Secondary | ICD-10-CM | POA: Diagnosis not present

## 2018-01-25 DIAGNOSIS — G518 Other disorders of facial nerve: Secondary | ICD-10-CM | POA: Diagnosis not present

## 2018-01-25 DIAGNOSIS — G43839 Menstrual migraine, intractable, without status migrainosus: Secondary | ICD-10-CM | POA: Diagnosis not present

## 2018-01-25 DIAGNOSIS — M791 Myalgia, unspecified site: Secondary | ICD-10-CM | POA: Diagnosis not present

## 2018-01-25 DIAGNOSIS — M542 Cervicalgia: Secondary | ICD-10-CM | POA: Diagnosis not present

## 2018-02-09 ENCOUNTER — Other Ambulatory Visit: Payer: Self-pay | Admitting: Internal Medicine

## 2018-02-15 DIAGNOSIS — H52223 Regular astigmatism, bilateral: Secondary | ICD-10-CM | POA: Diagnosis not present

## 2018-02-22 DIAGNOSIS — G518 Other disorders of facial nerve: Secondary | ICD-10-CM | POA: Diagnosis not present

## 2018-02-22 DIAGNOSIS — M791 Myalgia, unspecified site: Secondary | ICD-10-CM | POA: Diagnosis not present

## 2018-02-22 DIAGNOSIS — G43839 Menstrual migraine, intractable, without status migrainosus: Secondary | ICD-10-CM | POA: Diagnosis not present

## 2018-02-22 DIAGNOSIS — M542 Cervicalgia: Secondary | ICD-10-CM | POA: Diagnosis not present

## 2018-02-22 DIAGNOSIS — G43719 Chronic migraine without aura, intractable, without status migrainosus: Secondary | ICD-10-CM | POA: Diagnosis not present

## 2018-03-23 DIAGNOSIS — M542 Cervicalgia: Secondary | ICD-10-CM | POA: Diagnosis not present

## 2018-03-23 DIAGNOSIS — G518 Other disorders of facial nerve: Secondary | ICD-10-CM | POA: Diagnosis not present

## 2018-03-23 DIAGNOSIS — G43839 Menstrual migraine, intractable, without status migrainosus: Secondary | ICD-10-CM | POA: Diagnosis not present

## 2018-03-23 DIAGNOSIS — G43719 Chronic migraine without aura, intractable, without status migrainosus: Secondary | ICD-10-CM | POA: Diagnosis not present

## 2018-03-23 DIAGNOSIS — M791 Myalgia, unspecified site: Secondary | ICD-10-CM | POA: Diagnosis not present

## 2018-03-29 DIAGNOSIS — H5213 Myopia, bilateral: Secondary | ICD-10-CM | POA: Diagnosis not present

## 2018-04-18 DIAGNOSIS — G43839 Menstrual migraine, intractable, without status migrainosus: Secondary | ICD-10-CM | POA: Diagnosis not present

## 2018-04-18 DIAGNOSIS — G43719 Chronic migraine without aura, intractable, without status migrainosus: Secondary | ICD-10-CM | POA: Diagnosis not present

## 2018-04-18 DIAGNOSIS — M791 Myalgia, unspecified site: Secondary | ICD-10-CM | POA: Diagnosis not present

## 2018-04-18 DIAGNOSIS — G518 Other disorders of facial nerve: Secondary | ICD-10-CM | POA: Diagnosis not present

## 2018-04-18 DIAGNOSIS — M542 Cervicalgia: Secondary | ICD-10-CM | POA: Diagnosis not present

## 2018-05-02 DIAGNOSIS — G43839 Menstrual migraine, intractable, without status migrainosus: Secondary | ICD-10-CM | POA: Diagnosis not present

## 2018-05-02 DIAGNOSIS — M542 Cervicalgia: Secondary | ICD-10-CM | POA: Diagnosis not present

## 2018-05-02 DIAGNOSIS — G43719 Chronic migraine without aura, intractable, without status migrainosus: Secondary | ICD-10-CM | POA: Diagnosis not present

## 2018-05-02 DIAGNOSIS — G518 Other disorders of facial nerve: Secondary | ICD-10-CM | POA: Diagnosis not present

## 2018-05-02 DIAGNOSIS — M791 Myalgia, unspecified site: Secondary | ICD-10-CM | POA: Diagnosis not present

## 2018-05-10 DIAGNOSIS — J069 Acute upper respiratory infection, unspecified: Secondary | ICD-10-CM | POA: Diagnosis not present

## 2018-05-12 DIAGNOSIS — J208 Acute bronchitis due to other specified organisms: Secondary | ICD-10-CM | POA: Diagnosis not present

## 2018-05-12 DIAGNOSIS — B9689 Other specified bacterial agents as the cause of diseases classified elsewhere: Secondary | ICD-10-CM | POA: Diagnosis not present

## 2018-05-25 DIAGNOSIS — G43719 Chronic migraine without aura, intractable, without status migrainosus: Secondary | ICD-10-CM | POA: Diagnosis not present

## 2018-05-25 DIAGNOSIS — M791 Myalgia, unspecified site: Secondary | ICD-10-CM | POA: Diagnosis not present

## 2018-05-25 DIAGNOSIS — M542 Cervicalgia: Secondary | ICD-10-CM | POA: Diagnosis not present

## 2018-05-25 DIAGNOSIS — G518 Other disorders of facial nerve: Secondary | ICD-10-CM | POA: Diagnosis not present

## 2018-05-25 DIAGNOSIS — G43839 Menstrual migraine, intractable, without status migrainosus: Secondary | ICD-10-CM | POA: Diagnosis not present

## 2018-05-29 ENCOUNTER — Ambulatory Visit
Admission: RE | Admit: 2018-05-29 | Discharge: 2018-05-29 | Disposition: A | Discharge status: Home / Self Care | Payer: BLUE CROSS/BLUE SHIELD | Source: Ambulatory Visit | Attending: Obstetrics and Gynecology | Admitting: Obstetrics and Gynecology

## 2018-05-29 DIAGNOSIS — N6320 Unspecified lump in the left breast, unspecified quadrant: Secondary | ICD-10-CM

## 2018-05-29 DIAGNOSIS — N6323 Unspecified lump in the left breast, lower outer quadrant: Secondary | ICD-10-CM | POA: Diagnosis not present

## 2018-05-29 DIAGNOSIS — R922 Inconclusive mammogram: Secondary | ICD-10-CM | POA: Diagnosis not present

## 2018-07-05 DIAGNOSIS — M791 Myalgia, unspecified site: Secondary | ICD-10-CM | POA: Diagnosis not present

## 2018-07-05 DIAGNOSIS — M542 Cervicalgia: Secondary | ICD-10-CM | POA: Diagnosis not present

## 2018-07-05 DIAGNOSIS — G43719 Chronic migraine without aura, intractable, without status migrainosus: Secondary | ICD-10-CM | POA: Diagnosis not present

## 2018-07-05 DIAGNOSIS — G518 Other disorders of facial nerve: Secondary | ICD-10-CM | POA: Diagnosis not present

## 2018-07-05 DIAGNOSIS — G43839 Menstrual migraine, intractable, without status migrainosus: Secondary | ICD-10-CM | POA: Diagnosis not present

## 2018-07-19 DIAGNOSIS — G43839 Menstrual migraine, intractable, without status migrainosus: Secondary | ICD-10-CM | POA: Diagnosis not present

## 2018-07-19 DIAGNOSIS — M791 Myalgia, unspecified site: Secondary | ICD-10-CM | POA: Diagnosis not present

## 2018-07-19 DIAGNOSIS — M542 Cervicalgia: Secondary | ICD-10-CM | POA: Diagnosis not present

## 2018-07-19 DIAGNOSIS — G43719 Chronic migraine without aura, intractable, without status migrainosus: Secondary | ICD-10-CM | POA: Diagnosis not present

## 2018-07-19 DIAGNOSIS — G518 Other disorders of facial nerve: Secondary | ICD-10-CM | POA: Diagnosis not present

## 2018-08-24 DIAGNOSIS — G43719 Chronic migraine without aura, intractable, without status migrainosus: Secondary | ICD-10-CM | POA: Diagnosis not present

## 2018-08-24 DIAGNOSIS — G43839 Menstrual migraine, intractable, without status migrainosus: Secondary | ICD-10-CM | POA: Diagnosis not present

## 2018-09-22 ENCOUNTER — Telehealth: Payer: Self-pay | Admitting: Nurse Practitioner

## 2018-09-22 NOTE — Telephone Encounter (Signed)
I called patient to schedule appointment for CPE per [facility] request. Patient stated that she will call back at a later time to schedule.

## 2018-11-30 DIAGNOSIS — Z6821 Body mass index (BMI) 21.0-21.9, adult: Secondary | ICD-10-CM | POA: Diagnosis not present

## 2018-11-30 DIAGNOSIS — Z01419 Encounter for gynecological examination (general) (routine) without abnormal findings: Secondary | ICD-10-CM | POA: Diagnosis not present

## 2019-01-08 DIAGNOSIS — Z872 Personal history of diseases of the skin and subcutaneous tissue: Secondary | ICD-10-CM | POA: Diagnosis not present

## 2019-01-08 DIAGNOSIS — L570 Actinic keratosis: Secondary | ICD-10-CM | POA: Diagnosis not present

## 2019-01-08 DIAGNOSIS — D225 Melanocytic nevi of trunk: Secondary | ICD-10-CM | POA: Diagnosis not present

## 2019-01-17 ENCOUNTER — Ambulatory Visit: Payer: BLUE CROSS/BLUE SHIELD | Admitting: Nurse Practitioner

## 2019-02-19 DIAGNOSIS — M542 Cervicalgia: Secondary | ICD-10-CM | POA: Diagnosis not present

## 2019-02-19 DIAGNOSIS — G43839 Menstrual migraine, intractable, without status migrainosus: Secondary | ICD-10-CM | POA: Diagnosis not present

## 2019-02-19 DIAGNOSIS — G43719 Chronic migraine without aura, intractable, without status migrainosus: Secondary | ICD-10-CM | POA: Diagnosis not present

## 2019-02-21 ENCOUNTER — Ambulatory Visit: Payer: BLUE CROSS/BLUE SHIELD | Admitting: Nurse Practitioner

## 2019-02-26 ENCOUNTER — Other Ambulatory Visit: Payer: Self-pay

## 2019-02-26 ENCOUNTER — Ambulatory Visit (INDEPENDENT_AMBULATORY_CARE_PROVIDER_SITE_OTHER): Payer: BC Managed Care – PPO | Admitting: Nurse Practitioner

## 2019-02-26 ENCOUNTER — Encounter: Payer: Self-pay | Admitting: Nurse Practitioner

## 2019-02-26 VITALS — BP 92/70 | HR 58 | Temp 97.7°F | Ht 69.0 in | Wt 141.8 lb

## 2019-02-26 DIAGNOSIS — Z23 Encounter for immunization: Secondary | ICD-10-CM | POA: Diagnosis not present

## 2019-02-26 DIAGNOSIS — Z13220 Encounter for screening for lipoid disorders: Secondary | ICD-10-CM | POA: Diagnosis not present

## 2019-02-26 DIAGNOSIS — Z136 Encounter for screening for cardiovascular disorders: Secondary | ICD-10-CM | POA: Diagnosis not present

## 2019-02-26 DIAGNOSIS — Z0000 Encounter for general adult medical examination without abnormal findings: Secondary | ICD-10-CM

## 2019-02-26 LAB — COMPREHENSIVE METABOLIC PANEL
ALT: 22 U/L (ref 0–35)
AST: 25 U/L (ref 0–37)
Albumin: 4 g/dL (ref 3.5–5.2)
Alkaline Phosphatase: 57 U/L (ref 39–117)
BUN: 22 mg/dL (ref 6–23)
CO2: 28 mEq/L (ref 19–32)
Calcium: 9.5 mg/dL (ref 8.4–10.5)
Chloride: 103 mEq/L (ref 96–112)
Creatinine, Ser: 0.86 mg/dL (ref 0.40–1.20)
GFR: 67.27 mL/min (ref >60.00)
Glucose, Bld: 93 mg/dL (ref 70–99)
Potassium: 4.3 mEq/L (ref 3.5–5.1)
Sodium: 138 mEq/L (ref 135–145)
Total Bilirubin: 0.6 mg/dL (ref 0.2–1.2)
Total Protein: 6.9 g/dL (ref 6.0–8.3)

## 2019-02-26 LAB — CBC
HCT: 37 % (ref 36.0–46.0)
Hemoglobin: 12.2 g/dL (ref 12.0–15.0)
MCHC: 33.1 g/dL (ref 30.0–36.0)
MCV: 92.1 fl (ref 78.0–100.0)
Platelets: 169 10*3/uL (ref 150.0–400.0)
RBC: 4.01 Mil/uL (ref 3.87–5.11)
RDW: 15.2 % (ref 11.5–15.5)
WBC: 3.5 10*3/uL — ABNORMAL LOW (ref 4.0–10.5)

## 2019-02-26 LAB — LIPID PANEL
Cholesterol: 177 mg/dL (ref 0–200)
HDL: 83.7 mg/dL (ref >39.00)
LDL Cholesterol: 74 mg/dL (ref 0–99)
NonHDL: 92.84
Total CHOL/HDL Ratio: 2
Triglycerides: 96 mg/dL (ref 0.0–149.0)
VLDL: 19.2 mg/dL (ref 0.0–40.0)

## 2019-02-26 LAB — TSH: TSH: 1.5 u[IU]/mL (ref 0.35–4.50)

## 2019-02-26 NOTE — Progress Notes (Signed)
Subjective:    Patient ID: Nancy Burns, female    DOB: 17-Jun-1960, 58 y.o.   MRN: [id number]  Patient presents today for complete physical   HPI  Sexual History (orientation,birth control, marital status, STD):married, sexually active, postmenopausal, up to date with PAP and mammogram  Depression/Suicide: Depression screen [facility] 2/9 02/26/2019 06/08/2017  Decreased Interest 0 0  Down, Depressed, Hopeless 0 0  PHQ - 2 Score 0 0   Vision:up to date  Dental:up to date  Immunizations: (TDAP, Hep C screen, Pneumovax, Influenza, zoster)  Health Maintenance  Topic Date Due  . Tetanus Vaccine  12/21/1977  . Colon Cancer Screening  12/21/2008  . Pap Smear  06/02/2018  . Flu Shot  01/06/2019  . HIV Screening  02/26/2020*  . Mammogram  05/29/2020  .  Hepatitis C: One time screening is recommended by Center for Disease Control  (CDC) for  adults born from [birth year] through 1965.   Completed  *Topic was postponed. The date shown is not the original due date.   Diet:regular.  Weight:  Wt Readings from Last 3 Encounters:  02/26/19 141 lb 12.8 oz (64.3 kg)  10/19/17 140 lb 9.6 oz (63.8 kg)  06/08/17 144 lb (65.3 kg)    Exercise: walking and running  Fall Risk: Fall Risk  02/26/2019 06/08/2017  Falls in the past year? 0 No   Medications and allergies reviewed with patient and updated if appropriate.  Patient Active Problem List   Diagnosis Date Noted  . Chronic migraine without aura without status migrainosus, not intractable 07/20/2016    Current Outpatient Medications on File Prior to Visit  Medication Sig Dispense Refill  . baclofen (LIORESAL) 10 MG tablet Take 10 mg by mouth 3 (three) times daily.    Marland Kitchen MAGNESIUM PO Take by mouth.    . TOSYMRA 10 MG/ACT SOLN USE 1 SPRAY AS NEEDED FOR MIGRAINE. MAY REPEAT ONCE AFTER 1 2 HOURS *USE PLATINUM PASS COUPON    . SUMAtriptan (IMITREX) 100 MG tablet Take 100 mg by mouth every 2 (two) hours as needed for migraine. May repeat in 2 hours if  headache persists or recurs.     No current facility-administered medications on file prior to visit.     Past Medical History:  Diagnosis Date  . Migraines     No past surgical history on file.  Social History   Socioeconomic History  . Marital status: Married    Spouse name: Not on file  . Number of children: 2  . Years of education: Not on file  . Highest education level: Not on file  Occupational History  . Not on file  Social Needs  . Financial resource strain: Not on file  . Food insecurity    Worry: Not on file    Inability: Not on file  . Transportation needs    Medical: Not on file    Non-medical: Not on file  Tobacco Use  . Smoking status: Never Smoker  . Smokeless tobacco: Never Used  Substance and Sexual Activity  . Alcohol use: Yes    Comment: social  . Drug use: No  . Sexual activity: Yes  Lifestyle  . Physical activity    Days per week: Not on file    Minutes per session: Not on file  . Stress: Not on file  Relationships  . Social Musician on phone: Not on file    Gets together: Not on file    Attends religious service:  Not on file    Active member of club or organization: Not on file    Attends meetings of clubs or organizations: Not on file    Relationship status: Not on file  Other Topics Concern  . Not on file  Social History Narrative  . Not on file    Family History  Problem Relation Age of Onset  . Diabetes Father   . Heart disease Father   . Hypertension Father   . Hypertension Brother   . Heart disease Mother   . Osteoporosis Sister         Review of Systems  Constitutional: Negative for fever, malaise/fatigue and weight loss.  HENT: Negative for congestion and sore throat.   Eyes:       Negative for visual changes  Respiratory: Negative for cough and shortness of breath.   Cardiovascular: Negative for chest pain, palpitations and leg swelling.  Gastrointestinal: Negative for blood in stool, constipation,  diarrhea and heartburn.  Genitourinary: Negative for dysuria, frequency and urgency.  Musculoskeletal: Negative for falls, joint pain and myalgias.  Skin: Negative for rash.  Neurological: Negative for dizziness, sensory change and headaches.  Endo/Heme/Allergies: Does not bruise/bleed easily.  Psychiatric/Behavioral: Negative for depression, substance abuse and suicidal ideas. The patient is not nervous/anxious.     Objective:   Vitals:   02/26/19 0859  BP: 92/70  Pulse: (!) 58  Temp: 97.7 F (36.5 C)  SpO2: 100%    Body mass index is 20.94 kg/m.   Physical Examination:  Physical Exam Vitals signs reviewed.  Constitutional:      General: She is not in acute distress.    Appearance: She is well-developed and normal weight.  HENT:     Head: Normocephalic.     Right Ear: Tympanic membrane, ear canal and external ear normal.     Left Ear: Tympanic membrane, ear canal and external ear normal.  Eyes:     Extraocular Movements: Extraocular movements intact.     Conjunctiva/sclera: Conjunctivae normal.  Neck:     Musculoskeletal: Normal range of motion.  Cardiovascular:     Rate and Rhythm: Normal rate and regular rhythm.     Pulses: Normal pulses.     Heart sounds: Normal heart sounds.  Pulmonary:     Effort: Pulmonary effort is normal. No respiratory distress.     Breath sounds: Normal breath sounds.  Chest:     Chest wall: No tenderness.  Abdominal:     General: Bowel sounds are normal.     Palpations: Abdomen is soft.  Genitourinary:    Comments: Deferred breast and pelvic exam to GYN per aptient Musculoskeletal: Normal range of motion.     Right lower leg: No edema.     Left lower leg: No edema.  Lymphadenopathy:     Cervical: No cervical adenopathy.  Skin:    General: Skin is warm and dry.     Findings: No rash.  Neurological:     Mental Status: She is alert and oriented to person, place, and time.     Deep Tendon Reflexes: Reflexes are normal and  symmetric.  Psychiatric:        Mood and Affect: Mood normal.        Behavior: Behavior normal.        Thought Content: Thought content normal.    ASSESSMENT and PLAN:  Nancy Burns was seen today for annual exam.  Diagnoses and all orders for this visit:  Preventative health care -  CBC -     Comprehensive metabolic panel -     Lipid panel -     TSH  Encounter for lipid screening for cardiovascular disease -     Lipid panel  Need for immunization against influenza -     Flu Vaccine QUAD 36+ mos IM    No problem-specific Assessment & Plan notes found for this encounter.      Problem List Items Addressed This Visit    None    Visit Diagnoses    Preventative health care    -  Primary   Relevant Orders   CBC   Comprehensive metabolic panel   Lipid panel   TSH   Encounter for lipid screening for cardiovascular disease       Relevant Orders   Lipid panel   Need for immunization against influenza       Relevant Orders   Flu Vaccine QUAD 36+ mos IM (Completed)      Follow up: Return if symptoms worsen or fail to improve.  Wilfred Lacy, NP

## 2019-02-26 NOTE — Patient Instructions (Signed)
Go to lab for blood draw.  We will obtain recent PAP and mammogram from [place].  Health Maintenance, Female Adopting a healthy lifestyle and getting preventive care are important in promoting health and wellness. Ask your health care provider about:  The right schedule for you to have regular tests and exams.  Things you can do on your own to prevent diseases and keep yourself healthy. What should I know about diet, weight, and exercise? Eat a healthy diet   Eat a diet that includes plenty of vegetables, fruits, low-fat dairy products, and lean protein.  Do not eat a lot of foods that are high in solid fats, added sugars, or sodium. Maintain a healthy weight Body mass index (BMI) is used to identify weight problems. It estimates body fat based on height and weight. Your health care provider can help determine your BMI and help you achieve or maintain a healthy weight. Get regular exercise Get regular exercise. This is one of the most important things you can do for your health. Most adults should:  Exercise for at least 150 minutes each week. The exercise should increase your heart rate and make you sweat (moderate-intensity exercise).  Do strengthening exercises at least twice a week. This is in addition to the moderate-intensity exercise.  Spend less time sitting. Even light physical activity can be beneficial. Watch cholesterol and blood lipids Have your blood tested for lipids and cholesterol at 60 years of age, then have this test every 5 years. Have your cholesterol levels checked more often if:  Your lipid or cholesterol levels are high.  You are older than 60 years of age.  You are at high risk for heart disease. What should I know about cancer screening? Depending on your health history and family history, you may need to have cancer screening at various ages. This may include screening for:  Breast cancer.  Cervical cancer.  Colorectal cancer.  Skin cancer.   Lung cancer. What should I know about heart disease, diabetes, and high blood pressure? Blood pressure and heart disease  High blood pressure causes heart disease and increases the risk of stroke. This is more likely to develop in people who have high blood pressure readings, are of African descent, or are overweight.  Have your blood pressure checked: ? Every 3-5 years if you are 21-45 years of age. ? Every year if you are 62 years old or older. Diabetes Have regular diabetes screenings. This checks your fasting blood sugar level. Have the screening done:  Once every three years after age 62 if you are at a normal weight and have a low risk for diabetes.  More often and at a younger age if you are overweight or have a high risk for diabetes. What should I know about preventing infection? Hepatitis B If you have a higher risk for hepatitis B, you should be screened for this virus. Talk with your health care provider to find out if you are at risk for hepatitis B infection. Hepatitis C Testing is recommended for:  Everyone born from [birth year] through 1965.  Anyone with known risk factors for hepatitis C. Sexually transmitted infections (STIs)  Get screened for STIs, including gonorrhea and chlamydia, if: ? You are sexually active and are younger than 60 years of age. ? You are older than 60 years of age and your health care provider tells you that you are at risk for this type of infection. ? Your sexual activity has changed since you were last  screened, and you are at increased risk for chlamydia or gonorrhea. Ask your health care provider if you are at risk.  Ask your health care provider about whether you are at high risk for HIV. Your health care provider may recommend a prescription medicine to help prevent HIV infection. If you choose to take medicine to prevent HIV, you should first get tested for HIV. You should then be tested every 3 months for as long as you are taking the  medicine. Pregnancy  If you are about to stop having your period (premenopausal) and you may become pregnant, seek counseling before you get pregnant.  Take 400 to 800 micrograms (mcg) of folic acid every day if you become pregnant.  Ask for birth control (contraception) if you want to prevent pregnancy. Osteoporosis and menopause Osteoporosis is a disease in which the bones lose minerals and strength with aging. This can result in bone fractures. If you are 55 years old or older, or if you are at risk for osteoporosis and fractures, ask your health care provider if you should:  Be screened for bone loss.  Take a calcium or vitamin D supplement to lower your risk of fractures.  Be given hormone replacement therapy (HRT) to treat symptoms of menopause. Follow these instructions at home: Lifestyle  Do not use any products that contain nicotine or tobacco, such as cigarettes, e-cigarettes, and chewing tobacco. If you need help quitting, ask your health care provider.  Do not use street drugs.  Do not share needles.  Ask your health care provider for help if you need support or information about quitting drugs. Alcohol use  Do not drink alcohol if: ? Your health care provider tells you not to drink. ? You are pregnant, may be pregnant, or are planning to become pregnant.  If you drink alcohol: ? Limit how much you use to 0-1 drink a day. ? Limit intake if you are breastfeeding.  Be aware of how much alcohol is in your drink. In the [place] one drink equals one 12 oz bottle of beer (355 mL), one 5 oz glass of wine (148 mL), or one 1 oz glass of hard liquor (44 mL). General instructions  Schedule regular health, dental, and eye exams.  Stay current with your vaccines.  Tell your health care provider if: ? You often feel depressed. ? You have ever been abused or do not feel safe at home. Summary  Adopting a healthy lifestyle and getting preventive care are important in  promoting health and wellness.  Follow your health care provider's instructions about healthy diet, exercising, and getting tested or screened for diseases.  Follow your health care provider's instructions on monitoring your cholesterol and blood pressure. This information is not intended to replace advice given to you by your health care provider. Make sure you discuss any questions you have with your health care provider. Document Released: 12/07/2010 Document Revised: 05/17/2018 Document Reviewed: 05/17/2018 Elsevier Patient Education  2020 [employer].

## 2019-03-02 ENCOUNTER — Encounter: Payer: Self-pay | Admitting: Nurse Practitioner

## 2019-03-02 DIAGNOSIS — N6323 Unspecified lump in the left breast, lower outer quadrant: Secondary | ICD-10-CM | POA: Insufficient documentation

## 2019-03-27 DIAGNOSIS — M542 Cervicalgia: Secondary | ICD-10-CM | POA: Diagnosis not present

## 2019-03-27 DIAGNOSIS — G43719 Chronic migraine without aura, intractable, without status migrainosus: Secondary | ICD-10-CM | POA: Diagnosis not present

## 2019-03-27 DIAGNOSIS — G43839 Menstrual migraine, intractable, without status migrainosus: Secondary | ICD-10-CM | POA: Diagnosis not present

## 2019-04-09 DIAGNOSIS — H5213 Myopia, bilateral: Secondary | ICD-10-CM | POA: Diagnosis not present

## 2019-04-09 DIAGNOSIS — H52223 Regular astigmatism, bilateral: Secondary | ICD-10-CM | POA: Diagnosis not present

## 2019-04-09 DIAGNOSIS — H524 Presbyopia: Secondary | ICD-10-CM | POA: Diagnosis not present

## 2019-04-25 DIAGNOSIS — M542 Cervicalgia: Secondary | ICD-10-CM | POA: Diagnosis not present

## 2019-04-25 DIAGNOSIS — G43719 Chronic migraine without aura, intractable, without status migrainosus: Secondary | ICD-10-CM | POA: Diagnosis not present

## 2019-04-25 DIAGNOSIS — G43839 Menstrual migraine, intractable, without status migrainosus: Secondary | ICD-10-CM | POA: Diagnosis not present

## 2019-05-29 DIAGNOSIS — G43839 Menstrual migraine, intractable, without status migrainosus: Secondary | ICD-10-CM | POA: Diagnosis not present

## 2019-05-29 DIAGNOSIS — G43719 Chronic migraine without aura, intractable, without status migrainosus: Secondary | ICD-10-CM | POA: Diagnosis not present

## 2019-05-29 DIAGNOSIS — M542 Cervicalgia: Secondary | ICD-10-CM | POA: Diagnosis not present

## 2019-06-15 ENCOUNTER — Other Ambulatory Visit: Payer: Self-pay | Admitting: Obstetrics and Gynecology

## 2019-06-15 DIAGNOSIS — N6320 Unspecified lump in the left breast, unspecified quadrant: Secondary | ICD-10-CM

## 2019-07-03 ENCOUNTER — Other Ambulatory Visit: Payer: BC Managed Care – PPO

## 2019-07-17 DIAGNOSIS — G43839 Menstrual migraine, intractable, without status migrainosus: Secondary | ICD-10-CM | POA: Diagnosis not present

## 2019-07-17 DIAGNOSIS — G43719 Chronic migraine without aura, intractable, without status migrainosus: Secondary | ICD-10-CM | POA: Diagnosis not present

## 2019-07-17 DIAGNOSIS — M542 Cervicalgia: Secondary | ICD-10-CM | POA: Diagnosis not present

## 2019-07-24 ENCOUNTER — Other Ambulatory Visit: Payer: Self-pay

## 2019-07-24 ENCOUNTER — Ambulatory Visit
Admission: RE | Admit: 2019-07-24 | Discharge: 2019-07-24 | Disposition: A | Discharge status: Home / Self Care | Payer: BC Managed Care – PPO | Source: Ambulatory Visit | Attending: Obstetrics and Gynecology | Admitting: Obstetrics and Gynecology

## 2019-07-24 DIAGNOSIS — R922 Inconclusive mammogram: Secondary | ICD-10-CM | POA: Diagnosis not present

## 2019-07-24 DIAGNOSIS — N6320 Unspecified lump in the left breast, unspecified quadrant: Secondary | ICD-10-CM

## 2019-07-24 DIAGNOSIS — N6022 Fibroadenosis of left breast: Secondary | ICD-10-CM | POA: Diagnosis not present

## 2019-09-20 IMAGING — US ULTRASOUND LEFT BREAST LIMITED
1 series · 11 of 11 positions shown · non-contrast
Comparison: Previous exam(s).

CLINICAL DATA: 57-year-old female for 1 year follow-up of LEFT
breast masses and for annual bilateral mammogram

EXAM:
DIGITAL DIAGNOSTIC BILATERAL MAMMOGRAM WITH CAD AND TOMO
ULTRASOUND LEFT BREAST

[Series 1: ultrasound left breast limited · 0.04mm/px · 11 of 11 slices shown]
[im 1/11]
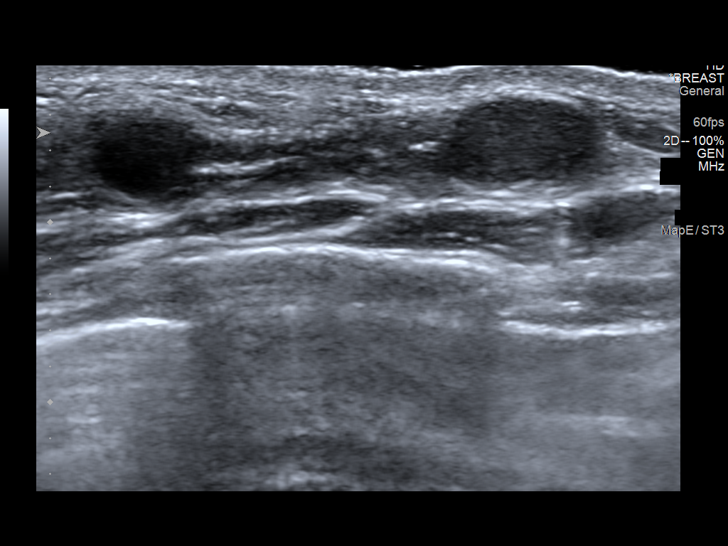
[im 2/11]
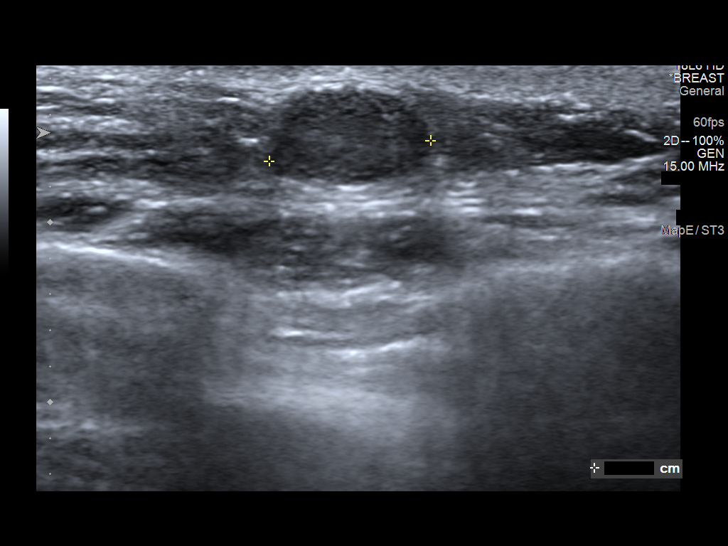
[im 3/11]
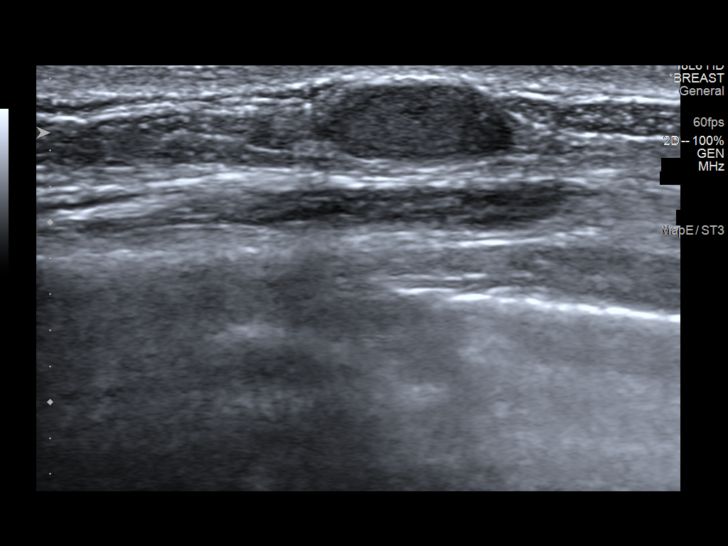
[im 4/11]
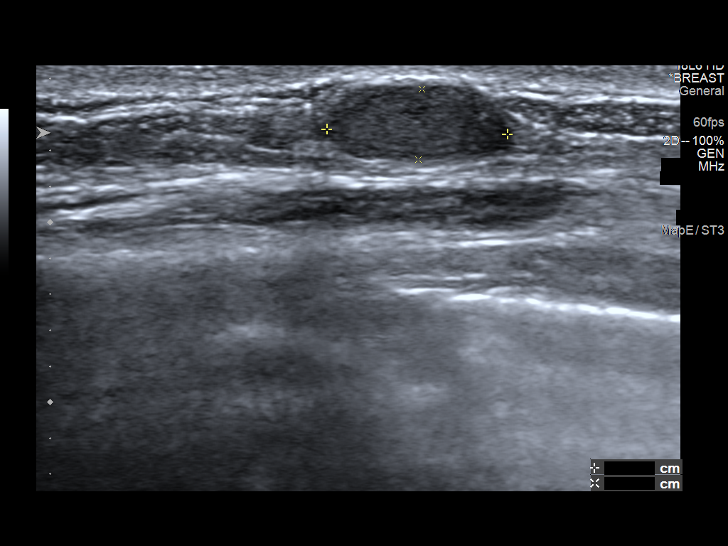
[im 5/11]
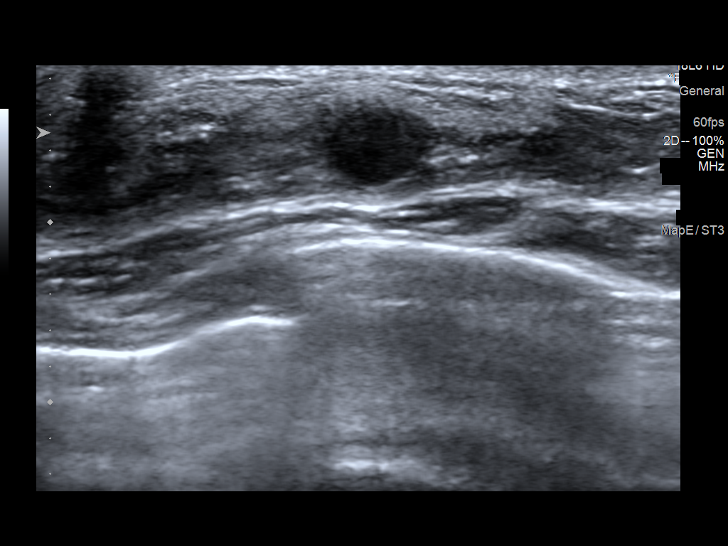
[im 6/11]
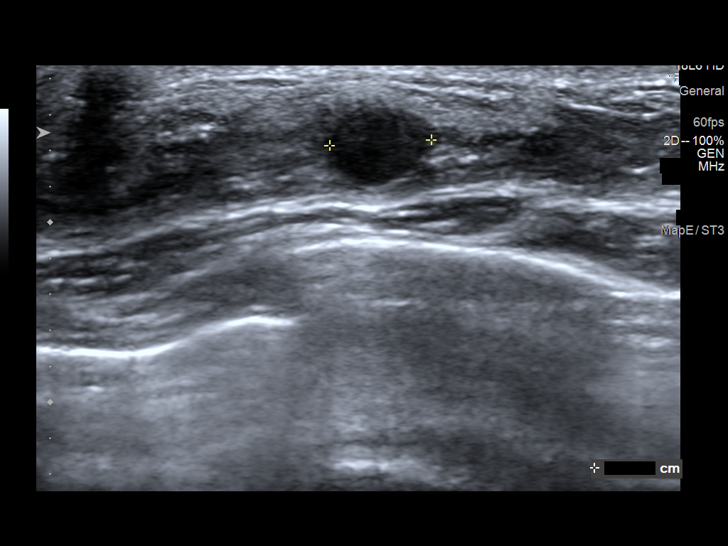
[im 7/11]
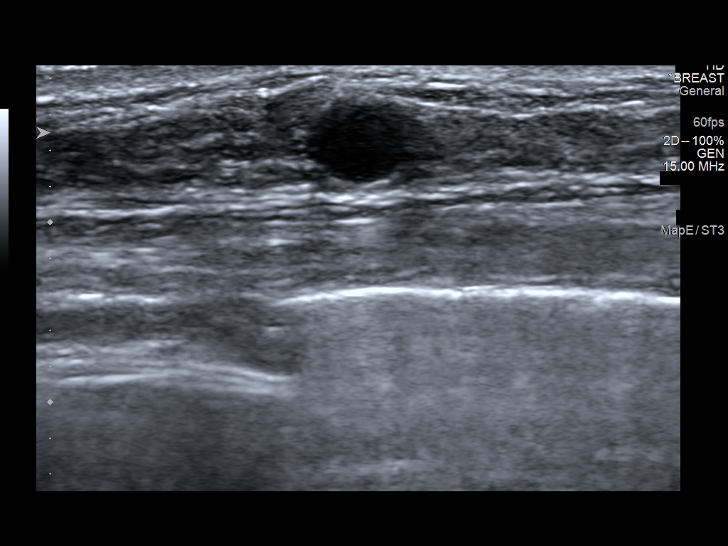
[im 8/11]
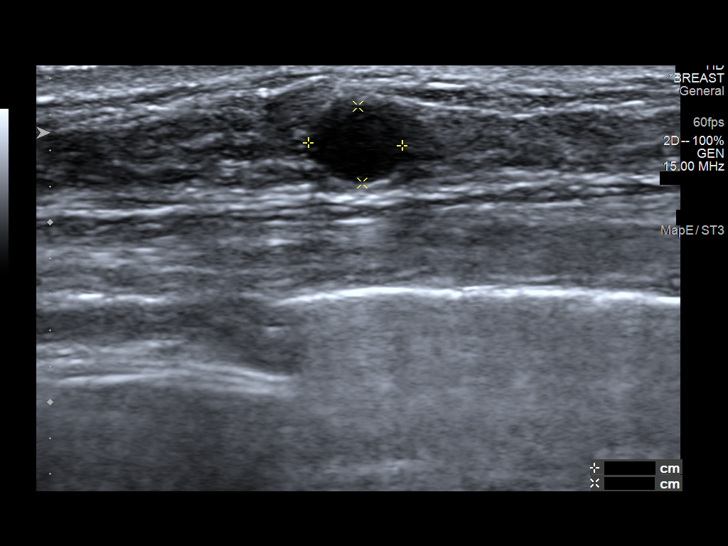
[im 9/11]
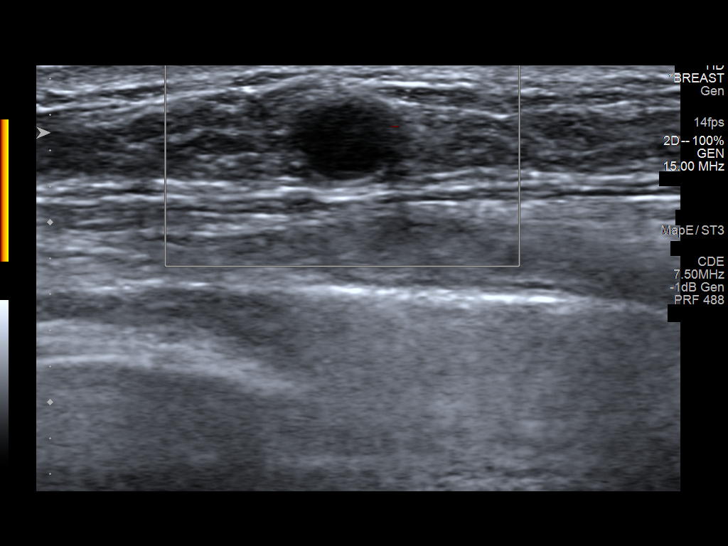
[im 10/11]
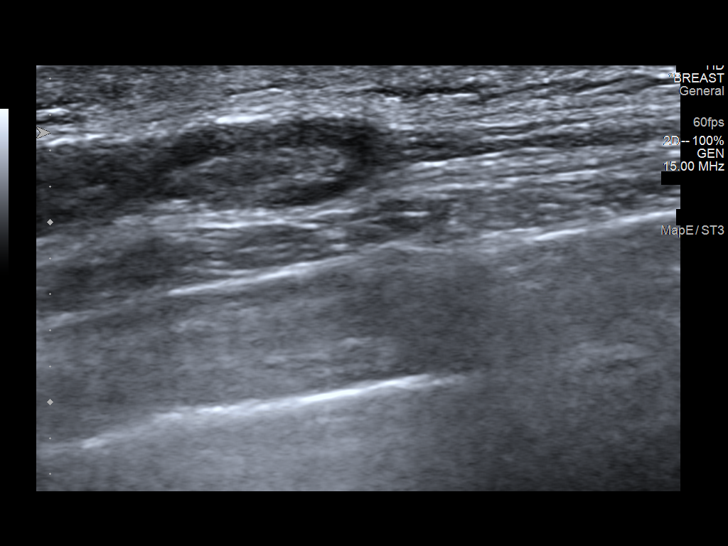
[im 11/11]
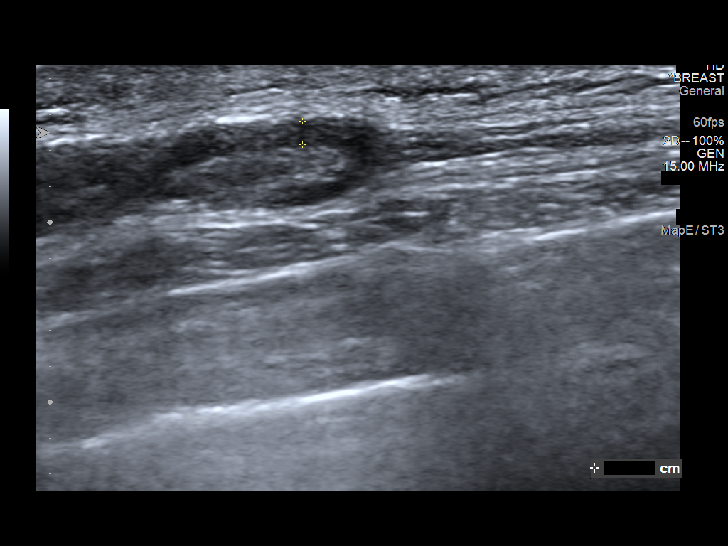

[11 of 11 positions shown; findings below may reference images not displayed]

ACR Breast Density Category d: The breast tissue is extremely dense,
which lowers the sensitivity of mammography.
FINDINGS: 2D/3D full field views of both breasts demonstrate no suspicious
mass, distortion or worrisome calcifications. The known LEFT breast
masses are difficult to visualize mammographically today.

Mammographic images were processed with CAD.

Targeted ultrasound is performed, showing stable circumscribed
hypoechoic parallel masses within the LEFT breast - a 1 x 0.4 x
cm mass at the 5 o'clock position 4 cm from the nipple and a 0.5 x
0.4 x 0.6 cm mass at the 5 o'clock position 1 cm from the nipple.
IMPRESSION: 1. Two stable masses within the LOWER OUTER LEFT breast. One
additional follow-up in 12 months recommended to ensure 2 year
stability.
2. No mammographic evidence of breast malignancy.

RECOMMENDATION:
Bilateral diagnostic mammogram with LEFT breast ultrasound in 12
months.

I have discussed the findings and recommendations with the patient.
Results were also provided in writing at the conclusion of the
visit. If applicable, a reminder letter will be sent to the patient
regarding the next appointment.

BI-RADS CATEGORY  3: Probably benign.

## 2019-09-20 IMAGING — MG DIGITAL DIAGNOSTIC BILATERAL MAMMOGRAM WITH TOMO AND CAD
8 series · 8 of 24 positions shown · non-contrast
Comparison: Previous exam(s).

CLINICAL DATA: 57-year-old female for 1 year follow-up of LEFT
breast masses and for annual bilateral mammogram

EXAM:
DIGITAL DIAGNOSTIC BILATERAL MAMMOGRAM WITH CAD AND TOMO
ULTRASOUND LEFT BREAST

[L CC synth-2D]
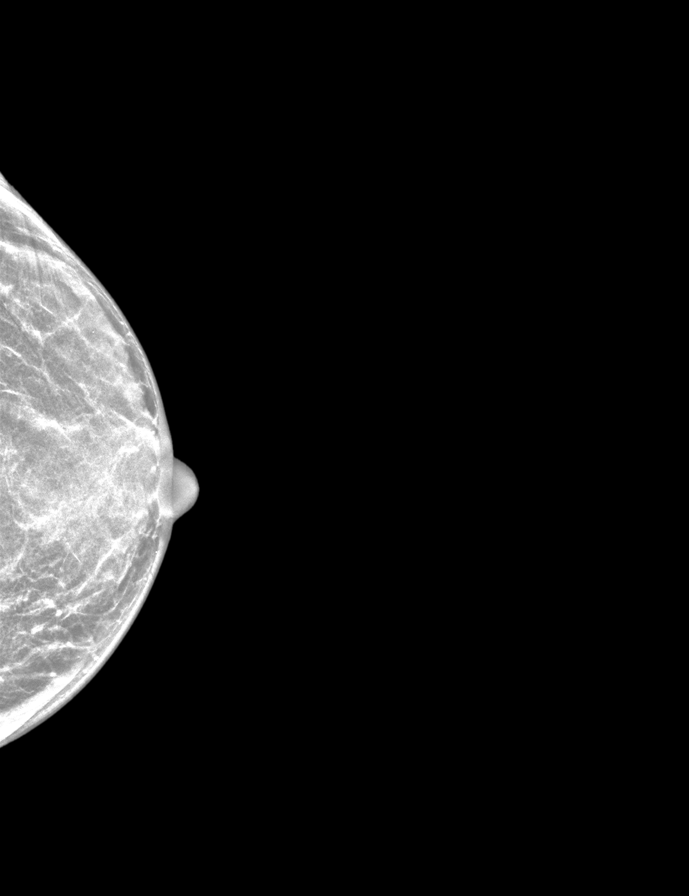

[R CC synth-2D]
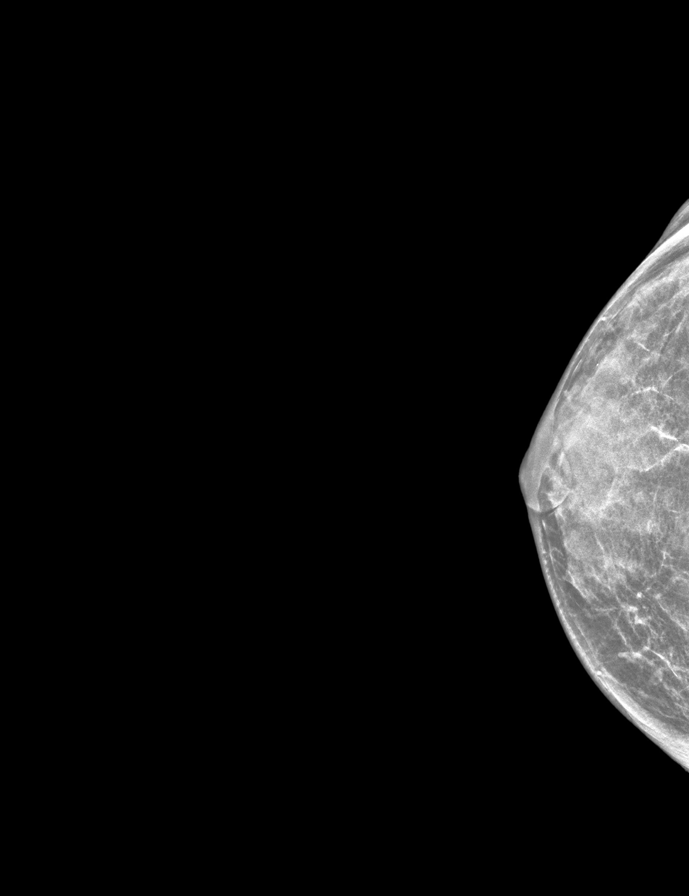

[R MLO synth-2D]
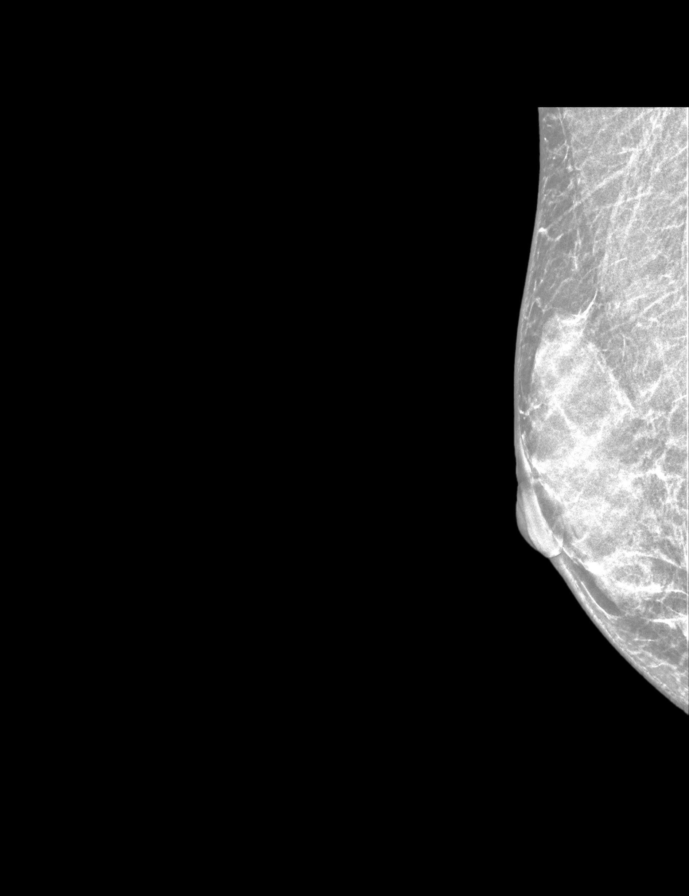

[L MLO synth-2D]
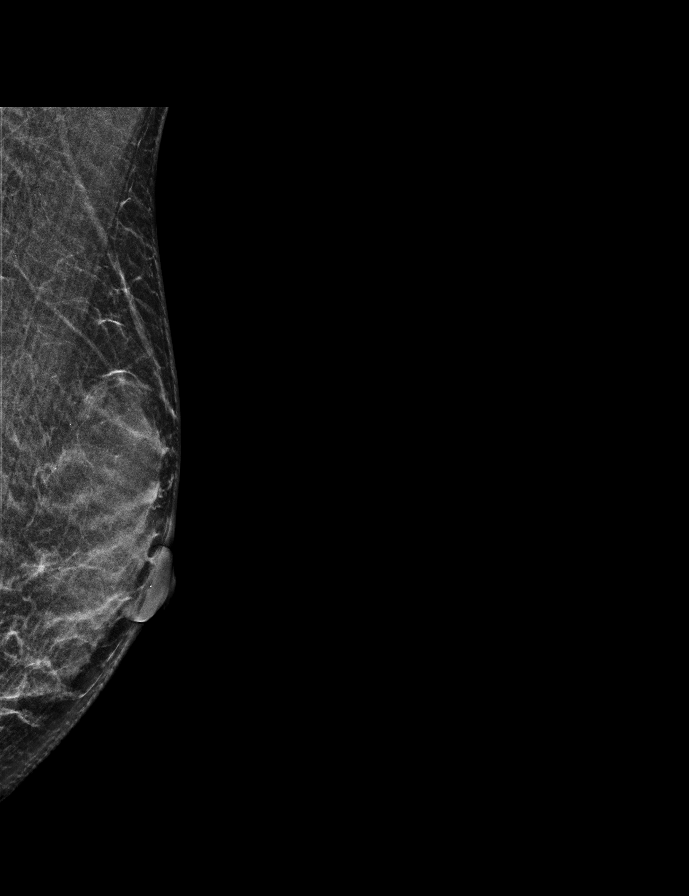

[L MLO tomo · tomo slice 18/35.0]
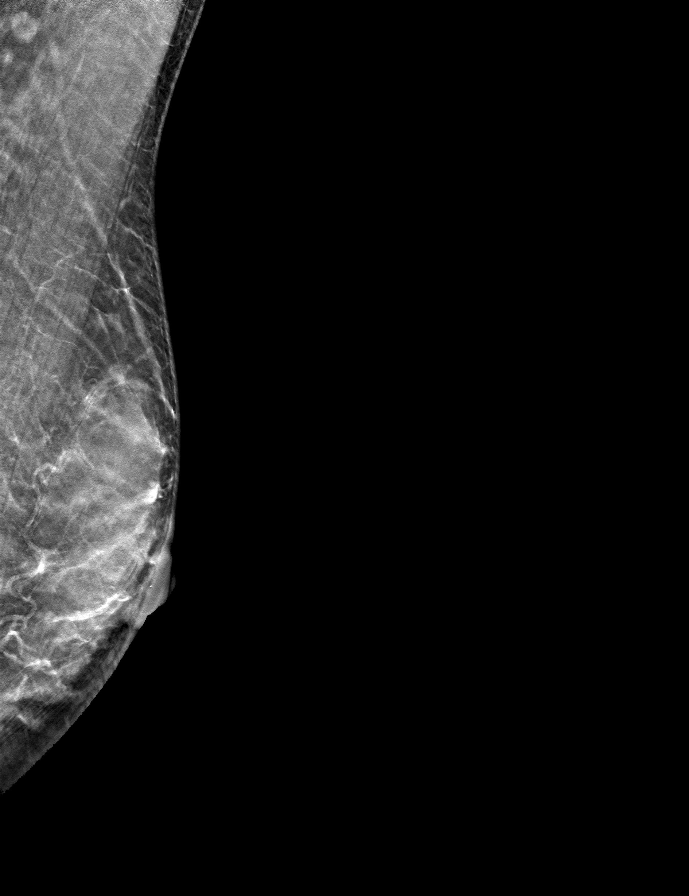

[L CC tomo · tomo slice 17/34.0]
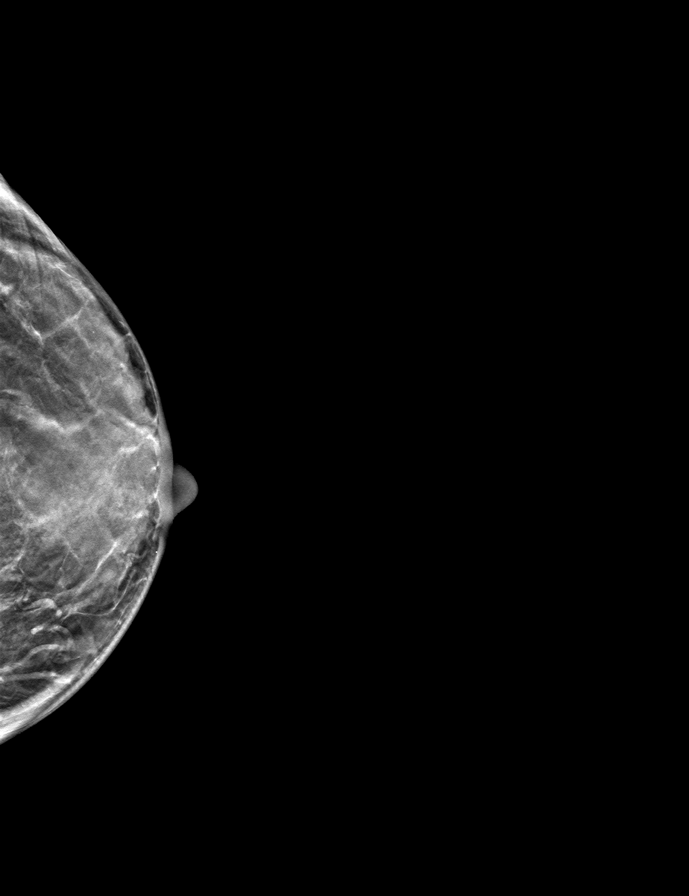

[R MLO tomo · tomo slice 19/37.0]
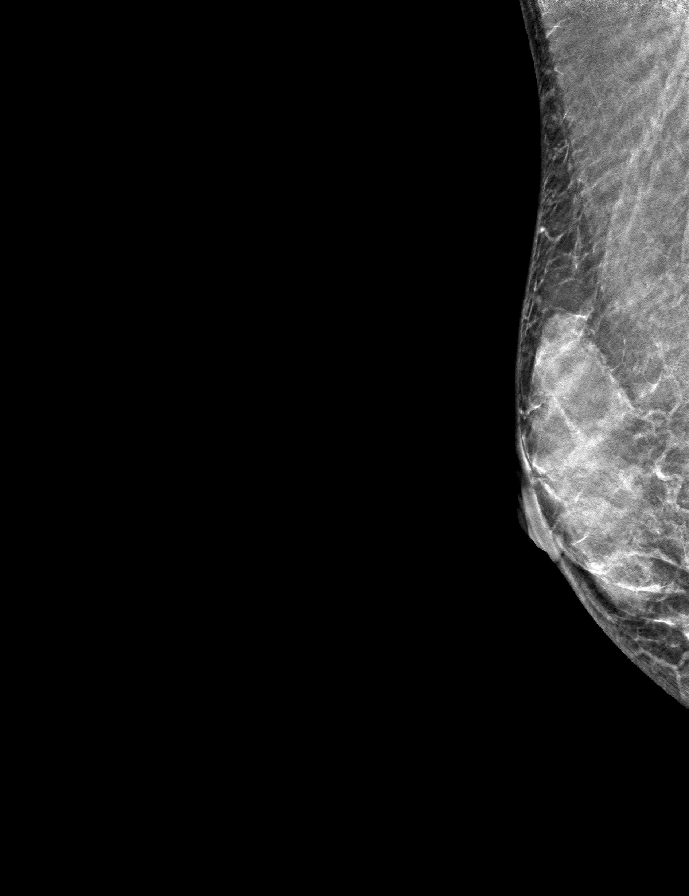

[R CC tomo · tomo slice 20/39.0]
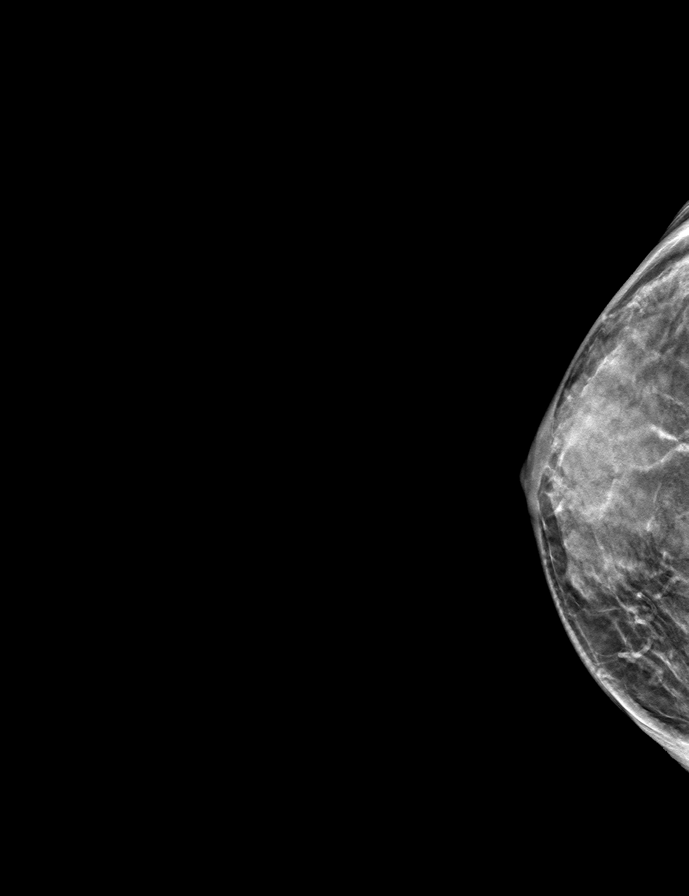

[8 of 24 positions shown; findings below may reference images not displayed]

ACR Breast Density Category d: The breast tissue is extremely dense,
which lowers the sensitivity of mammography.
FINDINGS: 2D/3D full field views of both breasts demonstrate no suspicious
mass, distortion or worrisome calcifications. The known LEFT breast
masses are difficult to visualize mammographically today.

Mammographic images were processed with CAD.

Targeted ultrasound is performed, showing stable circumscribed
hypoechoic parallel masses within the LEFT breast - a 1 x 0.4 x
cm mass at the 5 o'clock position 4 cm from the nipple and a 0.5 x
0.4 x 0.6 cm mass at the 5 o'clock position 1 cm from the nipple.
IMPRESSION: 1. Two stable masses within the LOWER OUTER LEFT breast. One
additional follow-up in 12 months recommended to ensure 2 year
stability.
2. No mammographic evidence of breast malignancy.

RECOMMENDATION:
Bilateral diagnostic mammogram with LEFT breast ultrasound in 12
months.

I have discussed the findings and recommendations with the patient.
Results were also provided in writing at the conclusion of the
visit. If applicable, a reminder letter will be sent to the patient
regarding the next appointment.

BI-RADS CATEGORY  3: Probably benign.

## 2019-10-01 DIAGNOSIS — N950 Postmenopausal bleeding: Secondary | ICD-10-CM | POA: Diagnosis not present

## 2019-10-24 DIAGNOSIS — N930 Postcoital and contact bleeding: Secondary | ICD-10-CM | POA: Diagnosis not present

## 2019-10-24 DIAGNOSIS — N950 Postmenopausal bleeding: Secondary | ICD-10-CM | POA: Diagnosis not present

## 2019-11-06 DIAGNOSIS — M542 Cervicalgia: Secondary | ICD-10-CM | POA: Diagnosis not present

## 2019-11-06 DIAGNOSIS — G43839 Menstrual migraine, intractable, without status migrainosus: Secondary | ICD-10-CM | POA: Diagnosis not present

## 2019-11-06 DIAGNOSIS — G43719 Chronic migraine without aura, intractable, without status migrainosus: Secondary | ICD-10-CM | POA: Diagnosis not present

## 2020-01-09 DIAGNOSIS — L820 Inflamed seborrheic keratosis: Secondary | ICD-10-CM | POA: Diagnosis not present

## 2020-01-09 DIAGNOSIS — L570 Actinic keratosis: Secondary | ICD-10-CM | POA: Diagnosis not present

## 2020-01-09 DIAGNOSIS — L821 Other seborrheic keratosis: Secondary | ICD-10-CM | POA: Diagnosis not present

## 2020-01-09 DIAGNOSIS — L578 Other skin changes due to chronic exposure to nonionizing radiation: Secondary | ICD-10-CM | POA: Diagnosis not present

## 2020-01-22 DIAGNOSIS — G43719 Chronic migraine without aura, intractable, without status migrainosus: Secondary | ICD-10-CM | POA: Diagnosis not present

## 2020-01-22 DIAGNOSIS — M542 Cervicalgia: Secondary | ICD-10-CM | POA: Diagnosis not present

## 2020-01-22 DIAGNOSIS — G43839 Menstrual migraine, intractable, without status migrainosus: Secondary | ICD-10-CM | POA: Diagnosis not present

## 2020-03-29 DIAGNOSIS — Z20822 Contact with and (suspected) exposure to covid-19: Secondary | ICD-10-CM | POA: Diagnosis not present

## 2020-04-01 DIAGNOSIS — M542 Cervicalgia: Secondary | ICD-10-CM | POA: Diagnosis not present

## 2020-04-01 DIAGNOSIS — G43719 Chronic migraine without aura, intractable, without status migrainosus: Secondary | ICD-10-CM | POA: Diagnosis not present

## 2020-04-01 DIAGNOSIS — G43839 Menstrual migraine, intractable, without status migrainosus: Secondary | ICD-10-CM | POA: Diagnosis not present

## 2020-04-09 DIAGNOSIS — H524 Presbyopia: Secondary | ICD-10-CM | POA: Diagnosis not present

## 2020-04-09 DIAGNOSIS — H52223 Regular astigmatism, bilateral: Secondary | ICD-10-CM | POA: Diagnosis not present

## 2020-05-21 ENCOUNTER — Encounter: Payer: BC Managed Care – PPO | Admitting: Nurse Practitioner

## 2020-05-21 DIAGNOSIS — Z20822 Contact with and (suspected) exposure to covid-19: Secondary | ICD-10-CM | POA: Diagnosis not present

## 2020-05-21 DIAGNOSIS — J9801 Acute bronchospasm: Secondary | ICD-10-CM | POA: Diagnosis not present

## 2020-05-21 DIAGNOSIS — B349 Viral infection, unspecified: Secondary | ICD-10-CM | POA: Diagnosis not present

## 2020-05-26 DIAGNOSIS — G43839 Menstrual migraine, intractable, without status migrainosus: Secondary | ICD-10-CM | POA: Diagnosis not present

## 2020-05-26 DIAGNOSIS — G43719 Chronic migraine without aura, intractable, without status migrainosus: Secondary | ICD-10-CM | POA: Diagnosis not present

## 2020-05-26 DIAGNOSIS — M542 Cervicalgia: Secondary | ICD-10-CM | POA: Diagnosis not present

## 2020-06-09 DIAGNOSIS — G43839 Menstrual migraine, intractable, without status migrainosus: Secondary | ICD-10-CM | POA: Diagnosis not present

## 2020-06-09 DIAGNOSIS — M542 Cervicalgia: Secondary | ICD-10-CM | POA: Diagnosis not present

## 2020-06-09 DIAGNOSIS — G43719 Chronic migraine without aura, intractable, without status migrainosus: Secondary | ICD-10-CM | POA: Diagnosis not present

## 2020-06-20 ENCOUNTER — Other Ambulatory Visit: Payer: Self-pay

## 2020-06-24 ENCOUNTER — Encounter: Payer: Self-pay | Admitting: Nurse Practitioner

## 2020-06-25 DIAGNOSIS — M542 Cervicalgia: Secondary | ICD-10-CM | POA: Diagnosis not present

## 2020-06-25 DIAGNOSIS — G43839 Menstrual migraine, intractable, without status migrainosus: Secondary | ICD-10-CM | POA: Diagnosis not present

## 2020-06-25 DIAGNOSIS — G43719 Chronic migraine without aura, intractable, without status migrainosus: Secondary | ICD-10-CM | POA: Diagnosis not present

## 2020-07-14 DIAGNOSIS — G43719 Chronic migraine without aura, intractable, without status migrainosus: Secondary | ICD-10-CM | POA: Diagnosis not present

## 2020-07-14 DIAGNOSIS — G43839 Menstrual migraine, intractable, without status migrainosus: Secondary | ICD-10-CM | POA: Diagnosis not present

## 2020-07-16 DIAGNOSIS — M542 Cervicalgia: Secondary | ICD-10-CM | POA: Diagnosis not present

## 2020-07-16 DIAGNOSIS — G43719 Chronic migraine without aura, intractable, without status migrainosus: Secondary | ICD-10-CM | POA: Diagnosis not present

## 2020-07-16 DIAGNOSIS — G43839 Menstrual migraine, intractable, without status migrainosus: Secondary | ICD-10-CM | POA: Diagnosis not present

## 2020-09-02 DIAGNOSIS — M542 Cervicalgia: Secondary | ICD-10-CM | POA: Diagnosis not present

## 2020-09-02 DIAGNOSIS — G43839 Menstrual migraine, intractable, without status migrainosus: Secondary | ICD-10-CM | POA: Diagnosis not present

## 2020-09-02 DIAGNOSIS — G43719 Chronic migraine without aura, intractable, without status migrainosus: Secondary | ICD-10-CM | POA: Diagnosis not present

## 2020-12-16 DIAGNOSIS — G43839 Menstrual migraine, intractable, without status migrainosus: Secondary | ICD-10-CM | POA: Diagnosis not present

## 2020-12-16 DIAGNOSIS — M542 Cervicalgia: Secondary | ICD-10-CM | POA: Diagnosis not present

## 2020-12-16 DIAGNOSIS — G43719 Chronic migraine without aura, intractable, without status migrainosus: Secondary | ICD-10-CM | POA: Diagnosis not present

## 2021-04-15 DIAGNOSIS — H52223 Regular astigmatism, bilateral: Secondary | ICD-10-CM | POA: Diagnosis not present

## 2021-04-15 DIAGNOSIS — H524 Presbyopia: Secondary | ICD-10-CM | POA: Diagnosis not present

## 2021-04-15 DIAGNOSIS — H5213 Myopia, bilateral: Secondary | ICD-10-CM | POA: Diagnosis not present

## 2021-05-21 ENCOUNTER — Telehealth (INDEPENDENT_AMBULATORY_CARE_PROVIDER_SITE_OTHER): Payer: BC Managed Care – PPO | Admitting: Family Medicine

## 2021-05-21 ENCOUNTER — Encounter: Payer: Self-pay | Admitting: Family Medicine

## 2021-05-21 VITALS — Temp 97.9°F

## 2021-05-21 DIAGNOSIS — U071 COVID-19: Secondary | ICD-10-CM

## 2021-05-21 DIAGNOSIS — J4521 Mild intermittent asthma with (acute) exacerbation: Secondary | ICD-10-CM | POA: Diagnosis not present

## 2021-05-21 MED ORDER — PREDNISONE 10 MG PO TABS: 10.0000 mg | ORAL_TABLET | Freq: Two times a day (BID) | ORAL | 0 refills | Status: AC

## 2021-05-21 MED ORDER — NIRMATRELVIR/RITONAVIR (PAXLOVID)TABLET: 3.0000 | ORAL_TABLET | Freq: Two times a day (BID) | ORAL | 0 refills | Status: AC

## 2021-05-21 MED ORDER — BENZONATATE 200 MG PO CAPS: 200.0000 mg | ORAL_CAPSULE | Freq: Two times a day (BID) | ORAL | 0 refills | Status: DC | PRN

## 2021-05-21 NOTE — Progress Notes (Signed)
Established Patient Office Visit  Subjective:  Patient ID: Nancy Burns, female    DOB: 07-12-1960  Age: 60 y.o. MRN: [id number]  CC:  Chief Complaint  Patient presents with   Covid Positive    Pt c/o nasal drainage, coughing, and some congestion x3 days. Covid + 12/14    HPI Kyrstan Gotwalt presents 2 day ho Headache, nasal congestion, pnd, cough. Denies change in taste. History of asthma as a child.   Past Medical History:  Diagnosis Date   Migraines     History reviewed. No pertinent surgical history.  Family History  Problem Relation Age of Onset   Diabetes Father    Heart disease Father    Hypertension Father    Hypertension Brother    Heart disease Mother    Osteoporosis Sister     Social History   Socioeconomic History   Marital status: Married    Spouse name: Not on file   Number of children: 2   Years of education: Not on file   Highest education level: Not on file  Occupational History   Not on file  Tobacco Use   Smoking status: Never   Smokeless tobacco: Never  Vaping Use   Vaping Use: Never used  Substance and Sexual Activity   Alcohol use: Yes    Comment: social   Drug use: No   Sexual activity: Yes  Other Topics Concern   Not on file  Social History Narrative   Not on file   Social Determinants of Health   Financial Resource Strain: Not on file  Food Insecurity: Not on file  Transportation Needs: Not on file  Physical Activity: Not on file  Stress: Not on file  Social Connections: Not on file  Intimate Partner Violence: Not on file    Outpatient Medications Prior to Visit  Medication Sig Dispense Refill   baclofen (LIORESAL) 10 MG tablet Take 10 mg by mouth 3 (three) times daily.     MAGNESIUM PO Take by mouth.     TOSYMRA 10 MG/ACT SOLN USE 1 SPRAY AS NEEDED FOR MIGRAINE. MAY REPEAT ONCE AFTER 1 2 HOURS *USE PLATINUM PASS COUPON     SUMAtriptan (IMITREX) 100 MG tablet Take 100 mg by mouth every 2 (two) hours as needed for  migraine. May repeat in 2 hours if headache persists or recurs. (Patient not taking: Reported on 05/21/2021)     No facility-administered medications prior to visit.    No Known Allergies  ROS Review of Systems  Constitutional:  Positive for fatigue. Negative for diaphoresis, fever and unexpected weight change.  HENT:  Positive for congestion, postnasal drip, rhinorrhea and sore throat.   Respiratory:  Positive for cough and wheezing.   Genitourinary: Negative.   Musculoskeletal:  Positive for myalgias.  Neurological:  Positive for dizziness and headaches.     Objective:    Physical Exam Nursing note reviewed.  Constitutional:      General: She is not in acute distress.    Appearance: Normal appearance. She is normal weight. She is not ill-appearing, toxic-appearing or diaphoretic.  HENT:     Head: Normocephalic and atraumatic.  Eyes:     General: No scleral icterus.       Right eye: No discharge.        Left eye: No discharge.  Pulmonary:     Effort: Pulmonary effort is normal.  Neurological:     Mental Status: She is alert and oriented to person, place, and time.  Psychiatric:        Mood and Affect: Mood normal.        Behavior: Behavior normal.    Temp 97.9 F (36.6 C) (Oral) Comment: patient reported Wt Readings from Last 3 Encounters:  02/26/19 141 lb 12.8 oz (64.3 kg)  10/19/17 140 lb 9.6 oz (63.8 kg)  06/08/17 144 lb (65.3 kg)     Health Maintenance Due  Topic Date Due   HIV Screening  Never done   TETANUS/TDAP  Never done   COLONOSCOPY (Pts 45-59yrs Insurance coverage will need to be confirmed)  Never done   Zoster Vaccines- Shingrix (1 of 2) Never done   PAP SMEAR-Modifier  09/05/2020   INFLUENZA VACCINE  01/05/2021    There are no preventive care reminders to display for this patient.  Lab Results  Component Value Date   TSH 1.50 02/26/2019   Lab Results  Component Value Date   WBC 3.5 (L) 02/26/2019   HGB 12.2 02/26/2019   HCT 37.0  02/26/2019   MCV 92.1 02/26/2019   PLT 169.0 02/26/2019   Lab Results  Component Value Date   NA 138 02/26/2019   K 4.3 02/26/2019   CO2 28 02/26/2019   GLUCOSE 93 02/26/2019   BUN 22 02/26/2019   CREATININE 0.86 02/26/2019   BILITOT 0.6 02/26/2019   ALKPHOS 57 02/26/2019   AST 25 02/26/2019   ALT 22 02/26/2019   PROT 6.9 02/26/2019   ALBUMIN 4.0 02/26/2019   CALCIUM 9.5 02/26/2019   GFR 67.27 02/26/2019   Lab Results  Component Value Date   CHOL 177 02/26/2019   Lab Results  Component Value Date   HDL 83.70 02/26/2019   Lab Results  Component Value Date   LDLCALC 74 02/26/2019   Lab Results  Component Value Date   TRIG 96.0 02/26/2019   Lab Results  Component Value Date   CHOLHDL 2 02/26/2019   No results found for: HGBA1C    Assessment & Plan:   Problem List Items Addressed This Visit   None Visit Diagnoses     COVID-19    -  Primary   Relevant Medications   nirmatrelvir/ritonavir EUA (PAXLOVID) 20 x 150 MG & 10 x 100MG  TABS   predniSONE (DELTASONE) 10 MG tablet   benzonatate (TESSALON) 200 MG capsule   Mild intermittent reactive airway disease with acute exacerbation       Relevant Medications   predniSONE (DELTASONE) 10 MG tablet       Meds ordered this encounter  Medications   nirmatrelvir/ritonavir EUA (PAXLOVID) 20 x 150 MG & 10 x 100MG  TABS    Sig: Take 3 tablets by mouth 2 (two) times daily for 5 days. (Take nirmatrelvir 150 mg two tablets twice daily for 5 days and ritonavir 100 mg one tablet twice daily for 5 days) Patient GFR is 67.    Dispense:  30 tablet    Refill:  0   predniSONE (DELTASONE) 10 MG tablet    Sig: Take 1 tablet (10 mg total) by mouth 2 (two) times daily with a meal for 5 days.    Dispense:  10 tablet    Refill:  0   benzonatate (TESSALON) 200 MG capsule    Sig: Take 1 capsule (200 mg total) by mouth 2 (two) times daily as needed for cough.    Dispense:  20 capsule    Refill:  0    Follow-up: Return in about 1  week (around 05/28/2021), or if symptoms  worsen or fail to improve.    Mliss Sax, MD

## 2021-06-09 ENCOUNTER — Encounter: Payer: Self-pay | Admitting: Family Medicine

## 2021-06-09 ENCOUNTER — Telehealth (INDEPENDENT_AMBULATORY_CARE_PROVIDER_SITE_OTHER): Payer: BC Managed Care – PPO | Admitting: Family Medicine

## 2021-06-09 VITALS — Temp 95.7°F | Wt 140.0 lb

## 2021-06-09 DIAGNOSIS — R059 Cough, unspecified: Secondary | ICD-10-CM

## 2021-06-09 MED ORDER — PROMETHAZINE-DM 6.25-15 MG/5ML PO SYRP: ORAL_SOLUTION | ORAL | 0 refills | Status: DC

## 2021-06-09 MED ORDER — BENZONATATE 100 MG PO CAPS: ORAL_CAPSULE | ORAL | 0 refills | Status: DC

## 2021-06-09 NOTE — Patient Instructions (Signed)
-  I sent the medication(s) we discussed to your pharmacy: Meds ordered this encounter  Medications   benzonatate (TESSALON PERLES) 100 MG capsule    Sig: 1-2 capsules up to twice daily as needed for cough    Dispense:  30 capsule    Refill:  0   promethazine-dextromethorphan (PROMETHAZINE-DM) 6.25-15 MG/5ML syrup    Sig: 81mL at bedtime as needed for cough    Dispense:  118 mL    Refill:  0     I hope you are feeling better soon!  Seek in person care promptly if your symptoms worsen, new concerns arise or you are not improving with treatment.  It was nice to meet you today. I help [facility] out with telemedicine visits on Tuesdays and Thursdays and am happy to help if you need a virtual follow up visit on those days. Otherwise, if you have any concerns or questions following this visit please schedule a follow up visit with your Primary Care office or seek care at a local urgent care clinic to avoid delays in care

## 2021-06-09 NOTE — Progress Notes (Signed)
Virtual Visit via Video Note  I connected with Nancy Burns  on 06/09/21 at  5:20 PM EST by a video enabled telemedicine application and verified that I am speaking with the correct person using two identifiers.  Location patient: [place] Location provider:work or home office Persons participating in the virtual visit: patient, provider  I discussed the limitations and requested verbal permission for telemedicine visit. The patient expressed understanding and agreed to proceed.   HPI:  Acute telemedicine visit for a cough: -Onset: about 3 days ago -has tested negative for covid -husband recently sick with bronchitis - was treated with tessalon and cough syrup - she wants the same (a cough pill for during the day and the cough syrup for at night) -Symptoms include:cough and fatigue -feeling better but still with a cough -Denies: fever, NVD, CP, SOB -Pertinent past medical history: see below, had covid in December -Pertinent medication allergies:No Known Allergies -COVID-19 vaccine status:  Immunization History  Administered Date(s) Administered   Influenza,inj,Quad PF,6+ Mos 02/26/2019   Influenza-Unspecified 05/15/2021   MMR 07/07/1987   PPD Test 06/14/2017     ROS: See pertinent positives and negatives per HPI.  Past Medical History:  Diagnosis Date   Migraines     History reviewed. No pertinent surgical history.   Current Outpatient Medications:    Ascorbic Acid (VITAMIN C PO), Take by mouth daily., Disp: , Rfl:    baclofen (LIORESAL) 10 MG tablet, Take 10 mg by mouth 3 (three) times daily., Disp: , Rfl:    benzonatate (TESSALON PERLES) 100 MG capsule, 1-2 capsules up to twice daily as needed for cough, Disp: 30 capsule, Rfl: 0   Feverfew 380 MG CAPS, Take by mouth., Disp: , Rfl:    MAGNESIUM PO, Take by mouth., Disp: , Rfl:    promethazine-dextromethorphan (PROMETHAZINE-DM) 6.25-15 MG/5ML syrup, 79m at bedtime as needed for cough, Disp: 118 mL, Rfl: 0   TOSYMRA 10 MG/ACT  SOLN, USE 1 SPRAY AS NEEDED FOR MIGRAINE. MAY REPEAT ONCE AFTER 1 2 HOURS *USE PLATINUM PASS COUPON, Disp: , Rfl:    VITAMIN D PO, Take 25 mcg by mouth daily., Disp: , Rfl:   EXAM:  VITALS per patient if applicable:  GENERAL: alert, oriented, appears well and in no acute distress  HEENT: atraumatic, conjunttiva clear, no obvious abnormalities on inspection of external nose and ears  NECK: normal movements of the head and neck  LUNGS: on inspection no signs of respiratory distress, breathing rate appears normal, no obvious gross SOB, gasping or wheezing  CV: no obvious cyanosis  MS: moves all visible extremities without noticeable abnormality  PSYCH/NEURO: pleasant and cooperative, no obvious depression or anxiety, speech and thought processing grossly intact  ASSESSMENT AND PLAN:  Discussed the following assessment and plan:  Cough, unspecified type  -we discussed possible serious and likely etiologies, options for evaluation and workup, limitations of telemedicine visit vs in person visit, treatment, treatment risks and precautions. Pt is agreeable to treatment via telemedicine at this moment. Reports is feeling better but has cough and is requesting 2 different cough medications. Rx after discussion not to overlap.  Advised to seek prompt virtual visit or in person care if worsening, new symptoms arise, or if is not improving with treatment as expected per our conversation of expected course. Discussed options for follow up care. Did let this patient know that I do telemedicine on Tuesdays and Thursdays for [facility] and those are the days I am logged into the system. Advised to schedule follow  up visit with PCP, [facility] virtual visits or [facility] if any further questions or concerns to avoid delays in care.   I discussed the assessment and treatment plan with the patient. The patient was provided an opportunity to ask questions and all were answered. The patient agreed with the plan and  demonstrated an understanding of the instructions.     Lucretia Kern, DO

## 2021-06-14 DIAGNOSIS — B349 Viral infection, unspecified: Secondary | ICD-10-CM | POA: Diagnosis not present

## 2021-06-14 DIAGNOSIS — J9801 Acute bronchospasm: Secondary | ICD-10-CM | POA: Diagnosis not present

## 2021-06-29 DIAGNOSIS — L821 Other seborrheic keratosis: Secondary | ICD-10-CM | POA: Diagnosis not present

## 2021-06-29 DIAGNOSIS — L578 Other skin changes due to chronic exposure to nonionizing radiation: Secondary | ICD-10-CM | POA: Diagnosis not present

## 2021-06-29 DIAGNOSIS — D225 Melanocytic nevi of trunk: Secondary | ICD-10-CM | POA: Diagnosis not present

## 2021-06-29 DIAGNOSIS — L570 Actinic keratosis: Secondary | ICD-10-CM | POA: Diagnosis not present

## 2021-06-30 ENCOUNTER — Encounter: Payer: Self-pay | Admitting: Nurse Practitioner

## 2021-06-30 ENCOUNTER — Telehealth: Payer: Self-pay | Admitting: Nurse Practitioner

## 2021-06-30 ENCOUNTER — Ambulatory Visit (INDEPENDENT_AMBULATORY_CARE_PROVIDER_SITE_OTHER): Payer: BC Managed Care – PPO | Admitting: Nurse Practitioner

## 2021-06-30 ENCOUNTER — Other Ambulatory Visit: Payer: Self-pay

## 2021-06-30 VITALS — BP 102/60 | HR 60 | Temp 96.8°F | Ht 68.75 in | Wt 151.4 lb

## 2021-06-30 DIAGNOSIS — Z13220 Encounter for screening for lipoid disorders: Secondary | ICD-10-CM

## 2021-06-30 DIAGNOSIS — Z136 Encounter for screening for cardiovascular disorders: Secondary | ICD-10-CM | POA: Diagnosis not present

## 2021-06-30 DIAGNOSIS — Z0000 Encounter for general adult medical examination without abnormal findings: Secondary | ICD-10-CM

## 2021-06-30 DIAGNOSIS — Z807 Family history of other malignant neoplasms of lymphoid, hematopoietic and related tissues: Secondary | ICD-10-CM | POA: Insufficient documentation

## 2021-06-30 DIAGNOSIS — Z1211 Encounter for screening for malignant neoplasm of colon: Secondary | ICD-10-CM | POA: Diagnosis not present

## 2021-06-30 DIAGNOSIS — Z23 Encounter for immunization: Secondary | ICD-10-CM

## 2021-06-30 DIAGNOSIS — J45991 Cough variant asthma: Secondary | ICD-10-CM | POA: Insufficient documentation

## 2021-06-30 LAB — CBC WITH DIFFERENTIAL/PLATELET
Basophils Absolute: 0 10*3/uL (ref 0.0–0.1)
Basophils Relative: 0.4 % (ref 0.0–3.0)
Eosinophils Absolute: 0.1 10*3/uL (ref 0.0–0.7)
Eosinophils Relative: 2.5 % (ref 0.0–5.0)
HCT: 37.9 % (ref 36.0–46.0)
Hemoglobin: 12.4 g/dL (ref 12.0–15.0)
Lymphocytes Relative: 27.9 % (ref 12.0–46.0)
Lymphs Abs: 1.2 10*3/uL (ref 0.7–4.0)
MCHC: 32.8 g/dL (ref 30.0–36.0)
MCV: 93 fl (ref 78.0–100.0)
Monocytes Absolute: 0.4 10*3/uL (ref 0.1–1.0)
Monocytes Relative: 9.4 % (ref 3.0–12.0)
Neutro Abs: 2.6 10*3/uL (ref 1.4–7.7)
Neutrophils Relative %: 59.8 % (ref 43.0–77.0)
Platelets: 189 10*3/uL (ref 150.0–400.0)
RBC: 4.07 Mil/uL (ref 3.87–5.11)
RDW: 13.4 % (ref 11.5–15.5)
WBC: 4.3 10*3/uL (ref 4.0–10.5)

## 2021-06-30 LAB — COMPREHENSIVE METABOLIC PANEL
ALT: 20 U/L (ref 0–35)
AST: 24 U/L (ref 0–37)
Albumin: 4.1 g/dL (ref 3.5–5.2)
Alkaline Phosphatase: 64 U/L (ref 39–117)
BUN: 26 mg/dL — ABNORMAL HIGH (ref 6–23)
CO2: 30 mEq/L (ref 19–32)
Calcium: 9.8 mg/dL (ref 8.4–10.5)
Chloride: 100 mEq/L (ref 96–112)
Creatinine, Ser: 0.85 mg/dL (ref 0.40–1.20)
GFR: 73.37 mL/min (ref >60.00)
Glucose, Bld: 85 mg/dL (ref 70–99)
Potassium: 4.7 mEq/L (ref 3.5–5.1)
Sodium: 136 mEq/L (ref 135–145)
Total Bilirubin: 0.4 mg/dL (ref 0.2–1.2)
Total Protein: 7.2 g/dL (ref 6.0–8.3)

## 2021-06-30 LAB — LIPID PANEL
Cholesterol: 177 mg/dL (ref 0–200)
HDL: 87.1 mg/dL (ref >39.00)
LDL Cholesterol: 74 mg/dL (ref 0–99)
NonHDL: 90
Total CHOL/HDL Ratio: 2
Triglycerides: 78 mg/dL (ref 0.0–149.0)
VLDL: 15.6 mg/dL (ref 0.0–40.0)

## 2021-06-30 LAB — TSH: TSH: 1.48 u[IU]/mL (ref 0.35–5.50)

## 2021-06-30 NOTE — Progress Notes (Signed)
Subjective:    Patient ID: Nancy Burns, female    DOB: 1960-12-18, 61 y.o.   MRN: [id number]  Patient presents today for CPE   HPI  Vision:up to date Dental:up to date Diet:heart healthy Exercise:walking 5x/week Weight:  Wt Readings from Last 3 Encounters:  06/30/21 151 lb 6.4 oz (68.7 kg)  06/09/21 140 lb (63.5 kg)  02/26/19 141 lb 12.8 oz (64.3 kg)    Sexual History (orientation,birth control, marital status, STD):breast and pelvic exam completed by Dr. Corinna Capra (GYN)  Depression/Suicide: Depression screen [facility] 2/9 06/30/2021 02/26/2019 06/08/2017  Decreased Interest 0 0 0  Down, Depressed, Hopeless 0 0 0  PHQ - 2 Score 0 0 0   Immunizations: (TDAP, Hep C screen, Pneumovax, Influenza, zoster)  Health Maintenance  Topic Date Due   Tetanus Vaccine  Never done   Zoster (Shingles) Vaccine (1 of 2) Never done   Cologuard (Stool DNA test)  06/20/2020   Pap Smear  09/05/2020   HIV Screening  06/30/2022*   Mammogram  07/23/2021   Flu Shot  Completed   Hepatitis C Screening: USPSTF Recommendation to screen - Ages 18-79 yo.  Completed   HPV Vaccine  Aged Out  *Topic was postponed. The date shown is not the original due date.   Fall Risk: Fall Risk  05/21/2021 02/26/2019 06/08/2017  Falls in the past year? 0 0 No  Number falls in past yr: 0 - -  Injury with Fall? 0 - -  Risk for fall due to : No Fall Risks - -   Medications and allergies reviewed with patient and updated if appropriate.  Patient Active Problem List   Diagnosis Date Noted   Family history of lymphoma 06/30/2021   Cough variant asthma 06/30/2021   Mass of lower outer quadrant of left breast 03/02/2019   Chronic migraine without aura without status migrainosus, not intractable 07/20/2016   Current Outpatient Medications on File Prior to Visit  Medication Sig Dispense Refill   Ascorbic Acid (VITAMIN C PO) Take by mouth daily.     baclofen (LIORESAL) 10 MG tablet Take 10 mg by mouth 3 (three) times daily.      Feverfew 380 MG CAPS Take by mouth.     MAGNESIUM PO Take by mouth.     promethazine-dextromethorphan (PROMETHAZINE-DM) 6.25-15 MG/5ML syrup 15mL at bedtime as needed for cough 118 mL 0   TOSYMRA 10 MG/ACT SOLN USE 1 SPRAY AS NEEDED FOR MIGRAINE. MAY REPEAT ONCE AFTER 1 2 HOURS *USE PLATINUM PASS COUPON     VITAMIN D PO Take 25 mcg by mouth daily.     No current facility-administered medications on file prior to visit.    Past Medical History:  Diagnosis Date   Asthma 06/2021   Bronchial Asthma   Migraines    History reviewed. No pertinent surgical history.  Social History   Socioeconomic History   Marital status: Married    Spouse name: Not on file   Number of children: 2   Years of education: Not on file   Highest education level: Not on file  Occupational History   Not on file  Tobacco Use   Smoking status: Never   Smokeless tobacco: Never  Vaping Use   Vaping Use: Never used  Substance and Sexual Activity   Alcohol use: Not Currently    Comment: social   Drug use: No   Sexual activity: Yes    Birth control/protection: Post-menopausal    Comment: menopause at age 41  Other Topics Concern   Not on file  Social History Narrative   Not on file   Social Determinants of Health   Financial Resource Strain: Not on file  Food Insecurity: Not on file  Transportation Needs: Not on file  Physical Activity: Not on file  Stress: Not on file  Social Connections: Not on file    Family History  Problem Relation Age of Onset   Cancer Mother 59       lymphoma   Heart disease Mother    Arthritis Mother    Thyroid disease Mother 58       thyroidectomy   Diabetes Father    Heart disease Father    Hypertension Father    Stroke Father    Osteoporosis Sister    Hypertension Brother    Vision loss Brother        Review of Systems  Constitutional:  Negative for fever, malaise/fatigue and weight loss.  HENT:  Negative for congestion and sore throat.   Eyes:         Negative for visual changes  Respiratory:  Negative for cough and shortness of breath.   Cardiovascular:  Negative for chest pain, palpitations and leg swelling.  Gastrointestinal:  Negative for blood in stool, constipation, diarrhea and heartburn.  Genitourinary:  Negative for dysuria, frequency and urgency.  Musculoskeletal:  Negative for falls, joint pain and myalgias.  Skin:  Negative for rash.  Neurological:  Negative for dizziness, sensory change and headaches.  Endo/Heme/Allergies:  Does not bruise/bleed easily.  Psychiatric/Behavioral:  Negative for depression, hallucinations, memory loss, substance abuse and suicidal ideas. The patient is not nervous/anxious and does not have insomnia.    Objective:   Vitals:   06/30/21 0833  BP: 102/60  Pulse: 60  Temp: (!) 96.8 F (36 C)  SpO2: 98%   Body mass index is 22.52 kg/m.  Physical Examination:  Physical Exam Vitals reviewed.  Constitutional:      General: She is not in acute distress.    Appearance: She is well-developed.  HENT:     Right Ear: Tympanic membrane, ear canal and external ear normal.     Left Ear: Tympanic membrane, ear canal and external ear normal.  Eyes:     General: No scleral icterus.    Extraocular Movements: Extraocular movements intact.     Conjunctiva/sclera: Conjunctivae normal.     Pupils: Pupils are equal, round, and reactive to light.  Cardiovascular:     Rate and Rhythm: Normal rate and regular rhythm.     Pulses: Normal pulses.     Heart sounds: Normal heart sounds.  Pulmonary:     Effort: Pulmonary effort is normal. No respiratory distress.     Breath sounds: Normal breath sounds.  Chest:     Chest wall: No tenderness.  Abdominal:     General: Bowel sounds are normal.     Palpations: Abdomen is soft.  Genitourinary:    Comments: Deferred breast and pelvic exam to GYN per patient Musculoskeletal:        General: Normal range of motion.     Cervical back: Normal range of motion and  neck supple.     Right lower leg: No edema.     Left lower leg: No edema.  Lymphadenopathy:     Cervical: No cervical adenopathy.  Skin:    General: Skin is warm and dry.  Neurological:     Mental Status: She is alert and oriented to person, place, and time.  Deep Tendon Reflexes: Reflexes are normal and symmetric.  Psychiatric:        Mood and Affect: Mood normal.        Behavior: Behavior normal.        Thought Content: Thought content normal.   ASSESSMENT and PLAN: This visit occurred during the SARS-CoV-2 public health emergency.  Safety protocols were in place, including screening questions prior to the visit, additional usage of staff PPE, and extensive cleaning of exam room while observing appropriate contact time as indicated for disinfecting solutions.   Flore was seen today for annual exam.  Diagnoses and all orders for this visit:  Preventative health care -     Cologuard; Future -     Comprehensive metabolic panel -     Lipid panel -     TSH -     CBC with Differential/Platelet  Encounter for lipid screening for cardiovascular disease -     Lipid panel  Colon cancer screening -     Cologuard; Future      Problem List Items Addressed This Visit   None Visit Diagnoses     Preventative health care    -  Primary   Relevant Orders   Cologuard   Comprehensive metabolic panel   Lipid panel   TSH   CBC with Differential/Platelet   Encounter for lipid screening for cardiovascular disease       Relevant Orders   Lipid panel   Colon cancer screening       Relevant Orders   Cologuard       Follow up: Return in about 1 year (around 06/30/2022) for CPE (fasting).  Wilfred Lacy, NP

## 2021-06-30 NOTE — Telephone Encounter (Signed)
No Show Letter came from [employer] for 06/09/2021 video appt. She should contact that office. [phone].  Thank you

## 2021-06-30 NOTE — Telephone Encounter (Signed)
LVM for patient to return call. 

## 2021-06-30 NOTE — Patient Instructions (Signed)
Please have PAP and mammogram report faxed to me °Go to lab for blood draw ° °Preventive Care 63-64 Years Old, Female °Preventive care refers to lifestyle choices and visits with your health care provider that can promote health and wellness. Preventive care visits are also called wellness exams. °What can I expect for my preventive care visit? °Counseling °Your health care provider may ask you questions about your: °Medical history, including: °Past medical problems. °Family medical history. °Pregnancy history. °Current health, including: °Menstrual cycle. °Method of birth control. °Emotional well-being. °Home life and relationship well-being. °Sexual activity and sexual health. °Lifestyle, including: °Alcohol, nicotine or tobacco, and drug use. °Access to firearms. °Diet, exercise, and sleep habits. °Work and work environment. °Sunscreen use. °Safety issues such as seatbelt and bike helmet use. °Physical exam °Your health care provider will check your: °Height and weight. These may be used to calculate your BMI (body mass index). BMI is a measurement that tells if you are at a healthy weight. °Waist circumference. This measures the distance around your waistline. This measurement also tells if you are at a healthy weight and may help predict your risk of certain diseases, such as type 2 diabetes and high blood pressure. °Heart rate and blood pressure. °Body temperature. °Skin for abnormal spots. °What immunizations do I need? °Vaccines are usually given at various ages, according to a schedule. Your health care provider will recommend vaccines for you based on your age, medical history, and lifestyle or other factors, such as travel or where you work. °What tests do I need? °Screening °Your health care provider may recommend screening tests for certain conditions. This may include: °Lipid and cholesterol levels. °Diabetes screening. This is done by checking your blood sugar (glucose) after you have not eaten for a  while (fasting). °Pelvic exam and Pap test. °Hepatitis B test. °Hepatitis C test. °HIV (human immunodeficiency virus) test. °STI (sexually transmitted infection) testing, if you are at risk. °Lung cancer screening. °Colorectal cancer screening. °Mammogram. Talk with your health care provider about when you should start having regular mammograms. This may depend on whether you have a family history of breast cancer. °BRCA-related cancer screening. This may be done if you have a family history of breast, ovarian, tubal, or peritoneal cancers. °Bone density scan. This is done to screen for osteoporosis. °Talk with your health care provider about your test results, treatment options, and if necessary, the need for more tests. °Follow these instructions at home: °Eating and drinking ° °Eat a diet that includes fresh fruits and vegetables, whole grains, lean protein, and low-fat dairy products. °Take vitamin and mineral supplements as recommended by your health care provider. °Do not drink alcohol if: °Your health care provider tells you not to drink. °You are pregnant, may be pregnant, or are planning to become pregnant. °If you drink alcohol: °Limit how much you have to 0-1 drink a day. °Know how much alcohol is in your drink. In the [place] one drink equals one 12 oz bottle of beer (355 mL), one 5 oz glass of wine (148 mL), or one 1½ oz glass of hard liquor (44 mL). °Lifestyle °Brush your teeth every morning and night with fluoride toothpaste. Floss one time each day. °Exercise for at least 30 minutes 5 or more days each week. °Do not use any products that contain nicotine or tobacco. These products include cigarettes, chewing tobacco, and vaping devices, such as e-cigarettes. If you need help quitting, ask your health care provider. °Do not use drugs. °If you   are sexually active, practice safe sex. Use a condom or other form of protection to prevent STIs. °If you do not wish to become pregnant, use a form of birth  control. If you plan to become pregnant, see your health care provider for a prepregnancy visit. °Take aspirin only as told by your health care provider. Make sure that you understand how much to take and what form to take. Work with your health care provider to find out whether it is safe and beneficial for you to take aspirin daily. °Find healthy ways to manage stress, such as: °Meditation, yoga, or listening to music. °Journaling. °Talking to a trusted person. °Spending time with friends and family. °Minimize exposure to UV radiation to reduce your risk of skin cancer. °Safety °Always wear your seat belt while driving or riding in a vehicle. °Do not drive: °If you have been drinking alcohol. Do not ride with someone who has been drinking. °When you are tired or distracted. °While texting. °If you have been using any mind-altering substances or drugs. °Wear a helmet and other protective equipment during sports activities. °If you have firearms in your house, make sure you follow all gun safety procedures. °Seek help if you have been physically or sexually abused. °What's next? °Visit your health care provider once a year for an annual wellness visit. °Ask your health care provider how often you should have your eyes and teeth checked. °Stay up to date on all vaccines. °This information is not intended to replace advice given to you by your health care provider. Make sure you discuss any questions you have with your health care provider. °Document Revised: 11/19/2020 Document Reviewed: 11/19/2020 °Elsevier Patient Education © 2022 [employer] Inc. ° °

## 2021-06-30 NOTE — Assessment & Plan Note (Addendum)
Stable with use of aleve prn Has not needed tosymra in several months

## 2021-07-07 DIAGNOSIS — Z6822 Body mass index (BMI) 22.0-22.9, adult: Secondary | ICD-10-CM | POA: Diagnosis not present

## 2021-07-07 DIAGNOSIS — Z01419 Encounter for gynecological examination (general) (routine) without abnormal findings: Secondary | ICD-10-CM | POA: Diagnosis not present

## 2021-07-13 LAB — HM PAP SMEAR

## 2021-10-07 DIAGNOSIS — Z1231 Encounter for screening mammogram for malignant neoplasm of breast: Secondary | ICD-10-CM | POA: Diagnosis not present

## 2021-10-12 ENCOUNTER — Other Ambulatory Visit: Payer: Self-pay | Admitting: Obstetrics and Gynecology

## 2021-10-12 DIAGNOSIS — R928 Other abnormal and inconclusive findings on diagnostic imaging of breast: Secondary | ICD-10-CM

## 2021-10-23 ENCOUNTER — Other Ambulatory Visit: Payer: BC Managed Care – PPO

## 2021-10-28 ENCOUNTER — Ambulatory Visit
Admission: RE | Admit: 2021-10-28 | Discharge: 2021-10-28 | Disposition: A | Discharge status: Home / Self Care | Payer: BC Managed Care – PPO | Source: Ambulatory Visit | Attending: Obstetrics and Gynecology | Admitting: Obstetrics and Gynecology

## 2021-10-28 DIAGNOSIS — N6325 Unspecified lump in the left breast, overlapping quadrants: Secondary | ICD-10-CM | POA: Diagnosis not present

## 2021-10-28 DIAGNOSIS — N6323 Unspecified lump in the left breast, lower outer quadrant: Secondary | ICD-10-CM | POA: Diagnosis not present

## 2021-10-28 DIAGNOSIS — R928 Other abnormal and inconclusive findings on diagnostic imaging of breast: Secondary | ICD-10-CM

## 2021-10-28 LAB — HM MAMMOGRAPHY

## 2022-01-28 DIAGNOSIS — M542 Cervicalgia: Secondary | ICD-10-CM | POA: Diagnosis not present

## 2022-01-28 DIAGNOSIS — G43719 Chronic migraine without aura, intractable, without status migrainosus: Secondary | ICD-10-CM | POA: Diagnosis not present

## 2022-02-12 ENCOUNTER — Telehealth: Payer: Self-pay | Admitting: Nurse Practitioner

## 2022-02-12 ENCOUNTER — Other Ambulatory Visit: Payer: Self-pay

## 2022-02-12 DIAGNOSIS — Z1211 Encounter for screening for malignant neoplasm of colon: Secondary | ICD-10-CM

## 2022-02-12 NOTE — Telephone Encounter (Signed)
Pt stated that she was suppose to get a cologuard test in January/February but pt stated she never received it

## 2022-02-12 NOTE — Telephone Encounter (Signed)
New order has been placed. 

## 2022-02-18 DIAGNOSIS — Z1211 Encounter for screening for malignant neoplasm of colon: Secondary | ICD-10-CM | POA: Diagnosis not present

## 2022-02-24 LAB — COLOGUARD: COLOGUARD: NEGATIVE

## 2022-04-21 ENCOUNTER — Telehealth: Payer: Self-pay | Admitting: Nurse Practitioner

## 2022-04-21 NOTE — Telephone Encounter (Signed)
Pt had a MM completed 10/28/2021. Please abstract

## 2022-04-21 NOTE — Telephone Encounter (Signed)
Abstracted

## 2022-05-26 ENCOUNTER — Encounter: Payer: Self-pay | Admitting: Nurse Practitioner

## 2022-05-26 ENCOUNTER — Ambulatory Visit: Payer: BC Managed Care – PPO | Admitting: Nurse Practitioner

## 2022-05-26 VITALS — BP 106/60 | HR 54 | Temp 97.0°F | Ht 68.0 in | Wt 147.0 lb

## 2022-05-26 DIAGNOSIS — J45991 Cough variant asthma: Secondary | ICD-10-CM | POA: Diagnosis not present

## 2022-05-26 DIAGNOSIS — J0100 Acute maxillary sinusitis, unspecified: Secondary | ICD-10-CM | POA: Diagnosis not present

## 2022-05-26 MED ORDER — AZITHROMYCIN 250 MG PO TABS: 250.0000 mg | ORAL_TABLET | Freq: Every day | ORAL | 0 refills | Status: DC

## 2022-05-26 MED ORDER — BENZONATATE 200 MG PO CAPS: 200.0000 mg | ORAL_CAPSULE | Freq: Three times a day (TID) | ORAL | 0 refills | Status: DC | PRN

## 2022-05-26 MED ORDER — TRIAMCINOLONE ACETONIDE 55 MCG/ACT NA AERO: 2.0000 | INHALATION_SPRAY | Freq: Every day | NASAL | 12 refills | Status: AC

## 2022-05-26 MED ORDER — ALBUTEROL SULFATE HFA 108 (90 BASE) MCG/ACT IN AERS: 2.0000 | INHALATION_SPRAY | Freq: Four times a day (QID) | RESPIRATORY_TRACT | 2 refills | Status: AC | PRN

## 2022-05-26 MED ORDER — CETIRIZINE HCL 10 MG PO TABS: 10.0000 mg | ORAL_TABLET | Freq: Every day | ORAL | 0 refills | Status: AC

## 2022-05-26 NOTE — Patient Instructions (Signed)
Start azithromycin, zyrtec and nasocort daily Use benzonatate and albuterol as needed for cough. Maintain adequate oral hydration Call office if no improvement in 1week

## 2022-05-26 NOTE — Progress Notes (Signed)
                Established Patient Visit  Patient: Nancy Burns   DOB: 1961/04/02   61 y.o. Female  MRN: [id number] Visit Date: 05/26/2022  Subjective:    Chief Complaint  Patient presents with  . Acute Visit    C/o cough, nasal congestion, body aches, denies fever x 3 months  Requesting records for pap   HPI Cough variant asthma Cough and sinus congestion x 52months, waxing and waning, no improvement with OTC decongestant and cough suppressant. No fever, Sob with exertion.  Sent azithromycin, benzonatate, albuterol. Advised to also incorporate nasocort and zyrtec OTC Get CXR if no improvement in 1week  Reviewed medical, surgical, and social history today  Medications: Outpatient Medications Prior to Visit  Medication Sig  . Ascorbic Acid (VITAMIN C PO) Take by mouth daily.  . baclofen (LIORESAL) 10 MG tablet Take 10 mg by mouth 3 (three) times daily.  . Feverfew 380 MG CAPS Take by mouth.  Marland Kitchen MAGNESIUM PO Take by mouth.  . TOSYMRA 10 MG/ACT SOLN USE 1 SPRAY AS NEEDED FOR MIGRAINE. MAY REPEAT ONCE AFTER 1 2 HOURS *USE PLATINUM PASS COUPON  . VITAMIN D PO Take 25 mcg by mouth daily.  . [DISCONTINUED] promethazine-dextromethorphan (PROMETHAZINE-DM) 6.25-15 MG/5ML syrup 48mL at bedtime as needed for cough (Patient not taking: Reported on 05/26/2022)   No facility-administered medications prior to visit.   Reviewed past medical and social history.   ROS per HPI above      Objective:  BP 106/60 (BP Location: Right Arm, Patient Position: Sitting, Cuff Size: Small)   Pulse (!) 54   Temp (!) 97 F (36.1 C) (Temporal)   Ht 5\' 8"  (1.727 m)   Wt 147 lb (66.7 kg)   SpO2 99%   BMI 22.35 kg/m      Physical Exam Cardiovascular:     Rate and Rhythm: Normal rate and regular rhythm.     Pulses: Normal pulses.     Heart sounds: Normal heart sounds.  Pulmonary:     Effort: Pulmonary effort is normal.     Breath sounds: Normal breath sounds.  Neurological:     Mental  Status: She is alert and oriented to person, place, and time.    No results found for any visits on 05/26/22.    Assessment & Plan:    Problem List Items Addressed This Visit       Respiratory   Cough variant asthma    Cough and sinus congestion x 28months, waxing and waning, no improvement with OTC decongestant and cough suppressant. No fever, Sob with exertion.  Sent azithromycin, benzonatate, albuterol. Advised to also incorporate nasocort and zyrtec OTC Get CXR if no improvement in 1week      Relevant Medications   albuterol (VENTOLIN HFA) 108 (90 Base) MCG/ACT inhaler   benzonatate (TESSALON) 200 MG capsule   Other Visit Diagnoses     Subacute maxillary sinusitis    -  Primary   Relevant Medications   azithromycin (ZITHROMAX Z-PAK) 250 MG tablet   benzonatate (TESSALON) 200 MG capsule   triamcinolone (NASACORT) 55 MCG/ACT AERO nasal inhaler   cetirizine (ZYRTEC) 10 MG tablet      Return if symptoms worsen or fail to improve.     1month, NP

## 2022-05-26 NOTE — Assessment & Plan Note (Signed)
Cough and sinus congestion x 62months, waxing and waning, no improvement with OTC decongestant and cough suppressant. No fever, Sob with exertion.  Sent azithromycin, benzonatate, albuterol. Advised to also incorporate nasocort and zyrtec OTC Get CXR if no improvement in 1week

## 2022-06-21 ENCOUNTER — Other Ambulatory Visit: Payer: Self-pay | Admitting: Nurse Practitioner

## 2022-06-21 DIAGNOSIS — J45991 Cough variant asthma: Secondary | ICD-10-CM

## 2022-07-05 ENCOUNTER — Ambulatory Visit (INDEPENDENT_AMBULATORY_CARE_PROVIDER_SITE_OTHER): Payer: BC Managed Care – PPO | Admitting: Nurse Practitioner

## 2022-07-05 ENCOUNTER — Encounter: Payer: Self-pay | Admitting: Nurse Practitioner

## 2022-07-05 VITALS — BP 106/60 | HR 60 | Temp 97.3°F | Ht 68.0 in | Wt 146.4 lb

## 2022-07-05 DIAGNOSIS — Z0000 Encounter for general adult medical examination without abnormal findings: Secondary | ICD-10-CM

## 2022-07-05 DIAGNOSIS — Z13220 Encounter for screening for lipoid disorders: Secondary | ICD-10-CM

## 2022-07-05 DIAGNOSIS — Z136 Encounter for screening for cardiovascular disorders: Secondary | ICD-10-CM | POA: Diagnosis not present

## 2022-07-05 DIAGNOSIS — Z807 Family history of other malignant neoplasms of lymphoid, hematopoietic and related tissues: Secondary | ICD-10-CM | POA: Diagnosis not present

## 2022-07-05 LAB — COMPREHENSIVE METABOLIC PANEL
ALT: 23 U/L (ref 0–35)
AST: 26 U/L (ref 0–37)
Albumin: 4.1 g/dL (ref 3.5–5.2)
Alkaline Phosphatase: 56 U/L (ref 39–117)
BUN: 32 mg/dL — ABNORMAL HIGH (ref 6–23)
CO2: 27 mEq/L (ref 19–32)
Calcium: 9.2 mg/dL (ref 8.4–10.5)
Chloride: 104 mEq/L (ref 96–112)
Creatinine, Ser: 0.83 mg/dL (ref 0.40–1.20)
GFR: 74.97 mL/min (ref >60.00)
Glucose, Bld: 85 mg/dL (ref 70–99)
Potassium: 4.4 mEq/L (ref 3.5–5.1)
Sodium: 140 mEq/L (ref 135–145)
Total Bilirubin: 0.4 mg/dL (ref 0.2–1.2)
Total Protein: 6.8 g/dL (ref 6.0–8.3)

## 2022-07-05 LAB — LIPID PANEL
Cholesterol: 186 mg/dL (ref 0–200)
HDL: 93.6 mg/dL (ref >39.00)
LDL Cholesterol: 80 mg/dL (ref 0–99)
NonHDL: 92.25
Total CHOL/HDL Ratio: 2
Triglycerides: 63 mg/dL (ref 0.0–149.0)
VLDL: 12.6 mg/dL (ref 0.0–40.0)

## 2022-07-05 NOTE — Progress Notes (Signed)
Complete physical exam  Patient: Nancy Burns   DOB: 1960-06-23   62 y.o. Female  MRN: [id number] Visit Date: 07/05/2022  Subjective:    Chief Complaint  Patient presents with   Annual Exam    CPE Pt fasting    Nancy Burns is a 62 y.o. female who presents today for a complete physical exam. She reports consuming a general diet.  Walking 3-4x/week  She generally feels well. She reports sleeping fairly well. She does not have additional problems to discuss today.  Vision:Yes Dental:No STD Screen:No  BP Readings from Last 3 Encounters:  07/05/22 106/60  05/26/22 106/60  06/30/21 102/60   Wt Readings from Last 3 Encounters:  07/05/22 146 lb 6.4 oz (66.4 kg)  05/26/22 147 lb (66.7 kg)  06/30/21 151 lb 6.4 oz (68.7 kg)   Insominia: use of magnesium or advil PM as needed, these improve sleep quality.  Most recent fall risk assessment:    05/21/2021    8:38 AM  Platinum in the past year? 0  Number falls in past yr: 0  Injury with Fall? 0  Risk for fall due to : No Fall Risks   Depression screen:Yes - No Depression  Most recent depression screenings:    07/05/2022    9:17 AM 06/30/2021    9:15 AM  PHQ 2/9 Scores  PHQ - 2 Score 0 0  PHQ- 9 Score 0    HPI  No problem-specific Assessment & Plan notes found for this encounter.  Past Medical History:  Diagnosis Date   Asthma 06/2021   Bronchial Asthma   Migraines    History reviewed. No pertinent surgical history. Social History   Socioeconomic History   Marital status: Married    Spouse name: Not on file   Number of children: 2   Years of education: Not on file   Highest education level: Not on file  Occupational History   Not on file  Tobacco Use   Smoking status: Never   Smokeless tobacco: Never  Vaping Use   Vaping Use: Never used  Substance and Sexual Activity   Alcohol use: Not Currently    Comment: social   Drug use: No   Sexual activity: Yes    Birth control/protection:  Post-menopausal    Comment: menopause at age 80  Other Topics Concern   Not on file  Social History Narrative   Not on file   Social Determinants of Health   Financial Resource Strain: Not on file  Food Insecurity: Not on file  Transportation Needs: Not on file  Physical Activity: Not on file  Stress: Not on file  Social Connections: Not on file  Intimate Partner Violence: Not on file   Family Status  Relation Name Status   Mother Nancy Burns Deceased   Father Nancy Burns Deceased   Sister  Alive   Brother Nancy Burns Alive   Daughter  Alive   Son  Alive   MGM  Deceased   MGF  Alive   [place]  Deceased   PGF  Deceased   Family History  Problem Relation Age of Onset   Cancer Mother 66       lymphoma   Heart disease Mother    Arthritis Mother    Thyroid disease Mother 30       thyroidectomy   Diabetes Father    Heart disease Father    Hypertension Father    Stroke Father  Osteoporosis Sister    Hypertension Brother    Vision loss Brother    No Known Allergies  Patient Care Team: Nancy Burns, Nancy Gains, NP as PCP - General (Internal Medicine) Nancy Camp, MD as Consulting Physician (Obstetrics and Gynecology)   Medications: Outpatient Medications Prior to Visit  Medication Sig   albuterol (VENTOLIN HFA) 108 (90 Base) MCG/ACT inhaler Inhale 2 puffs into the lungs every 6 (six) hours as needed for wheezing or shortness of breath.   Ascorbic Acid (VITAMIN C PO) Take by mouth daily.   cetirizine (ZYRTEC) 10 MG tablet Take 1 tablet (10 mg total) by mouth daily.   Feverfew 380 MG CAPS Take by mouth.   MAGNESIUM PO Take by mouth.   TOSYMRA 10 MG/ACT SOLN USE 1 SPRAY AS NEEDED FOR MIGRAINE. MAY REPEAT ONCE AFTER 1 2 HOURS *USE PLATINUM PASS COUPON   triamcinolone (NASACORT) 55 MCG/ACT AERO nasal inhaler Place 2 sprays into the nose daily.   VITAMIN D PO Take 25 mcg by mouth daily.   [DISCONTINUED] azithromycin (ZITHROMAX Z-PAK) 250 MG tablet  Take 1 tablet (250 mg total) by mouth daily. Take 2tabs on first day, then 1tab once a day till complete (Patient not taking: Reported on 07/05/2022)   [DISCONTINUED] baclofen (LIORESAL) 10 MG tablet Take 10 mg by mouth 3 (three) times daily. (Patient not taking: Reported on 07/05/2022)   [DISCONTINUED] benzonatate (TESSALON) 200 MG capsule TAKE 1 CAPSULE BY MOUTH THREE TIMES A DAY AS NEEDED (Patient not taking: Reported on 07/05/2022)   No facility-administered medications prior to visit.    Review of Systems  Constitutional:  Negative for fever.  HENT:  Negative for congestion and sore throat.   Eyes:        Negative for visual changes  Respiratory:  Negative for cough and shortness of breath.   Cardiovascular:  Negative for chest pain, palpitations and leg swelling.  Gastrointestinal:  Negative for blood in stool, constipation and diarrhea.  Genitourinary:  Negative for dysuria, frequency and urgency.  Musculoskeletal:  Negative for myalgias.  Skin:  Negative for rash.  Neurological:  Negative for dizziness and headaches.  Hematological:  Does not bruise/bleed easily.  Psychiatric/Behavioral:  Negative for suicidal ideas. The patient is not nervous/anxious.         Objective:  BP 106/60 (BP Location: Right Arm, Patient Position: Sitting, Cuff Size: Small)   Pulse 60   Temp (!) 97.3 F (36.3 C) (Temporal)   Ht 5\' 8"  (1.727 m)   Wt 146 lb 6.4 oz (66.4 kg)   SpO2 97%   BMI 22.26 kg/m     Physical Exam Vitals reviewed.  Constitutional:      General: She is not in acute distress.    Appearance: She is well-developed.  HENT:     Right Ear: Tympanic membrane, ear canal and external ear normal.     Left Ear: Tympanic membrane, ear canal and external ear normal.     Nose: Nose normal.     Mouth/Throat:     Pharynx: No oropharyngeal exudate.  Eyes:     Conjunctiva/sclera: Conjunctivae normal.     Pupils: Pupils are equal, round, and reactive to light.  Cardiovascular:      Rate and Rhythm: Normal rate and regular rhythm.     Heart sounds: Normal heart sounds.  Pulmonary:     Effort: Pulmonary effort is normal. No respiratory distress.     Breath sounds: Normal breath sounds.  Chest:     Chest wall:  No tenderness.  Abdominal:     General: Bowel sounds are normal.     Palpations: Abdomen is soft.  Genitourinary:    Comments: Breast and pelvic exam deferred to GYN Musculoskeletal:        General: Normal range of motion.     Cervical back: Normal range of motion and neck supple.     Right lower leg: No edema.     Left lower leg: No edema.  Lymphadenopathy:     Cervical: No cervical adenopathy.  Skin:    General: Skin is warm and dry.  Neurological:     Mental Status: She is alert and oriented to person, place, and time.     Deep Tendon Reflexes: Reflexes are normal and symmetric.  Psychiatric:        Mood and Affect: Mood normal.        Behavior: Behavior normal.        Thought Content: Thought content normal.      No results found for any visits on 07/05/22.    Assessment & Plan:    Routine Health Maintenance and Physical Exam  Immunization History  Administered Date(s) Administered   Influenza,inj,Quad PF,6+ Mos 02/26/2019   Influenza-Unspecified 05/15/2021   MMR 07/07/1987   PPD Test 06/14/2017   Tdap 06/30/2021    Health Maintenance  Topic Date Due   COVID-19 Vaccine (1) 07/21/2022 (Originally 06/24/1961)   Zoster Vaccines- Shingrix (1 of 2) 08/25/2022 (Originally 12/21/2008)   INFLUENZA VACCINE  09/05/2022 (Originally 01/05/2022)   HIV Screening  07/06/2023 (Originally 12/21/1973)   MAMMOGRAM  10/29/2023   PAP SMEAR-Modifier  07/13/2024   Fecal DNA (Cologuard)  02/18/2025   DTaP/Tdap/Td (2 - Td or Tdap) 07/01/2031   Hepatitis C Screening  Completed   HPV VACCINES  Aged Out   Discussed health benefits of physical activity, and encouraged her to engage in regular exercise appropriate for her age and condition.  Problem List Items  Addressed This Visit       Other   Family history of lymphoma   Other Visit Diagnoses     Preventative health care    -  Primary   Relevant Orders   Comprehensive metabolic panel   Lipid panel   CBC with Differential/Platelet   Encounter for lipid screening for cardiovascular disease       Relevant Orders   Lipid panel      Return in about 1 year (around 07/06/2023) for CPE (fasting).     Wilfred Lacy, NP

## 2022-07-05 NOTE — Patient Instructions (Addendum)
Go to lab Schedule appt for mammogram, dental cleaning and eye exam  Preventive Care 22-64 Years Old, Female Preventive care refers to lifestyle choices and visits with your health care provider that can promote health and wellness. Preventive care visits are also called wellness exams. What can I expect for my preventive care visit? Counseling Your health care provider may ask you questions about your: Medical history, including: Past medical problems. Family medical history. Pregnancy history. Current health, including: Menstrual cycle. Method of birth control. Emotional well-being. Home life and relationship well-being. Sexual activity and sexual health. Lifestyle, including: Alcohol, nicotine or tobacco, and drug use. Access to firearms. Diet, exercise, and sleep habits. Work and work Statistician. Sunscreen use. Safety issues such as seatbelt and bike helmet use. Physical exam Your health care provider will check your: Height and weight. These may be used to calculate your BMI (body mass index). BMI is a measurement that tells if you are at a healthy weight. Waist circumference. This measures the distance around your waistline. This measurement also tells if you are at a healthy weight and may help predict your risk of certain diseases, such as type 2 diabetes and high blood pressure. Heart rate and blood pressure. Body temperature. Skin for abnormal spots. What immunizations do I need?  Vaccines are usually given at various ages, according to a schedule. Your health care provider will recommend vaccines for you based on your age, medical history, and lifestyle or other factors, such as travel or where you work. What tests do I need? Screening Your health care provider may recommend screening tests for certain conditions. This may include: Lipid and cholesterol levels. Diabetes screening. This is done by checking your blood sugar (glucose) after you have not eaten for a  while (fasting). Pelvic exam and Pap test. Hepatitis B test. Hepatitis C test. HIV (human immunodeficiency virus) test. STI (sexually transmitted infection) testing, if you are at risk. Lung cancer screening. Colorectal cancer screening. Mammogram. Talk with your health care provider about when you should start having regular mammograms. This may depend on whether you have a family history of breast cancer. BRCA-related cancer screening. This may be done if you have a family history of breast, ovarian, tubal, or peritoneal cancers. Bone density scan. This is done to screen for osteoporosis. Talk with your health care provider about your test results, treatment options, and if necessary, the need for more tests. Follow these instructions at home: Eating and drinking  Eat a diet that includes fresh fruits and vegetables, whole grains, lean protein, and low-fat dairy products. Take vitamin and mineral supplements as recommended by your health care provider. Do not drink alcohol if: Your health care provider tells you not to drink. You are pregnant, may be pregnant, or are planning to become pregnant. If you drink alcohol: Limit how much you have to 0-1 drink a day. Know how much alcohol is in your drink. In the [place] one drink equals one 12 oz bottle of beer (355 mL), one 5 oz glass of wine (148 mL), or one 1 oz glass of hard liquor (44 mL). Lifestyle Brush your teeth every morning and night with fluoride toothpaste. Floss one time each day. Exercise for at least 30 minutes 5 or more days each week. Do not use any products that contain nicotine or tobacco. These products include cigarettes, chewing tobacco, and vaping devices, such as e-cigarettes. If you need help quitting, ask your health care provider. Do not use drugs. If you are sexually  active, practice safe sex. Use a condom or other form of protection to prevent STIs. If you do not wish to become pregnant, use a form of birth  control. If you plan to become pregnant, see your health care provider for a prepregnancy visit. Take aspirin only as told by your health care provider. Make sure that you understand how much to take and what form to take. Work with your health care provider to find out whether it is safe and beneficial for you to take aspirin daily. Find healthy ways to manage stress, such as: Meditation, yoga, or listening to music. Journaling. Talking to a trusted person. Spending time with friends and family. Minimize exposure to UV radiation to reduce your risk of skin cancer. Safety Always wear your seat belt while driving or riding in a vehicle. Do not drive: If you have been drinking alcohol. Do not ride with someone who has been drinking. When you are tired or distracted. While texting. If you have been using any mind-altering substances or drugs. Wear a helmet and other protective equipment during sports activities. If you have firearms in your house, make sure you follow all gun safety procedures. Seek help if you have been physically or sexually abused. What's next? Visit your health care provider once a year for an annual wellness visit. Ask your health care provider how often you should have your eyes and teeth checked. Stay up to date on all vaccines. This information is not intended to replace advice given to you by your health care provider. Make sure you discuss any questions you have with your health care provider. Document Revised: 11/19/2020 Document Reviewed: 11/19/2020 Elsevier Patient Education  [place].

## 2022-07-23 ENCOUNTER — Telehealth: Payer: Self-pay | Admitting: Nurse Practitioner

## 2022-07-23 NOTE — Telephone Encounter (Signed)
Patient called and said she is in [facility] and said tested positive for covid and wanted to know Paxlovid could be called in for her to [facility] in Address: [address], [place] [address]. I did let her know that an appointment would probably be needed but unfortunately we are not licensed for [place]. Please advise patient what to do

## 2022-07-23 NOTE — Telephone Encounter (Signed)
Made patient aware of [place]'s instructions.

## 2022-10-21 ENCOUNTER — Other Ambulatory Visit: Payer: Self-pay | Admitting: Obstetrics and Gynecology

## 2022-10-21 DIAGNOSIS — R928 Other abnormal and inconclusive findings on diagnostic imaging of breast: Secondary | ICD-10-CM

## 2022-10-25 ENCOUNTER — Ambulatory Visit: Admission: RE | Admit: 2022-10-25 | Payer: BC Managed Care – PPO | Source: Ambulatory Visit

## 2022-10-25 ENCOUNTER — Ambulatory Visit
Admission: RE | Admit: 2022-10-25 | Discharge: 2022-10-25 | Disposition: A | Discharge status: Home / Self Care | Payer: BC Managed Care – PPO | Source: Ambulatory Visit | Attending: Obstetrics and Gynecology | Admitting: Obstetrics and Gynecology

## 2022-10-25 DIAGNOSIS — R928 Other abnormal and inconclusive findings on diagnostic imaging of breast: Secondary | ICD-10-CM

## 2022-11-02 ENCOUNTER — Other Ambulatory Visit: Payer: BC Managed Care – PPO

## 2023-01-03 ENCOUNTER — Ambulatory Visit: Payer: BC Managed Care – PPO | Admitting: Nurse Practitioner

## 2023-01-03 ENCOUNTER — Encounter: Payer: Self-pay | Admitting: Nurse Practitioner

## 2023-01-03 VITALS — BP 120/60 | HR 56 | Temp 98.5°F | Resp 16 | Ht 68.0 in | Wt 149.2 lb

## 2023-01-03 DIAGNOSIS — G43709 Chronic migraine without aura, not intractable, without status migrainosus: Secondary | ICD-10-CM

## 2023-01-03 DIAGNOSIS — F5102 Adjustment insomnia: Secondary | ICD-10-CM | POA: Diagnosis not present

## 2023-01-03 MED ORDER — ESZOPICLONE 2 MG PO TABS
2.0000 mg | ORAL_TABLET | Freq: Every evening | ORAL | 0 refills | Status: AC | PRN
Start: 2023-01-03 — End: ?

## 2023-01-03 NOTE — Assessment & Plan Note (Addendum)
Interrupted sleep for several years, worse in last 6months due to increase stress at home. She is able to fall asleep but unable to stay asleep. Need for 1hr nap daily due to daytime fatigue, no snoring or apneic episodes Coffee 1.5cup daily, does not drink after 12noon. ALCOHOL: 2glass of wine per week Denies need for counseling. No improvement with advil Pm or magnesium glycinate. We discussed use of elavil vs trazodone vs restoril vs sonata vs lunesta. She declined use of elavil and trazodone due to fear of side effect-weight gain. Advised about possible adverse effects of hynoptic and need for short term use only. Therefore, no additional refill. She verbalized understanding. Sent lunesta 2mg  0.5tab Qdprn. She was advised, there will be no additional refills F/up prn

## 2023-01-03 NOTE — Assessment & Plan Note (Signed)
Managed by headache clinic in [place]. Uncontrolled Occurs daily triggers: lack of caffeine, poor sleep and stress Current treatment: abortive only: daily NSAIDs (aleve and/or advil), caffeine, trigger point injections, and tosymra nasal spray prn. We discussed use of elavil which will help with sleep and migraine headache.  She declined use of any preventative med due to fear of possible side effects. Continue f/up with Headache clinic

## 2023-01-03 NOTE — Progress Notes (Signed)
Established Patient Visit  Patient: SHARNEICE PLEGER   DOB: 03-06-1961   62 y.o. Female  MRN: [id number] Visit Date: 01/03/2023  Subjective:    Chief Complaint  Patient presents with   Insomnia    Some thing to help her sleep.  She would like a repeat cmp because her BUN was elevated.    HPI Chronic migraine without aura without status migrainosus, not intractable Managed by headache clinic in [place]. Uncontrolled Occurs daily triggers: lack of caffeine, poor sleep and stress Current treatment: abortive only: daily NSAIDs (aleve and/or advil), caffeine, trigger point injections, and tosymra nasal spray prn. We discussed use of elavil which will help with sleep and migraine headache.  She declined use of any preventative med due to fear of possible side effects. Continue f/up with Headache clinic   Adjustment insomnia Interrupted sleep for several years, worse in last 6months due to increase stress at home. She is able to fall asleep but unable to stay asleep. Need for 1hr nap daily due to daytime fatigue, no snoring or apneic episodes Coffee 1.5cup daily, does not drink after 12noon. ALCOHOL: 2glass of wine per week Denies need for counseling. No improvement with advil Pm or magnesium glycinate. We discussed use of elavil vs trazodone vs restoril vs sonata vs lunesta. She declined use of elavil and trazodone due to fear of side effect-weight gain. Advised about possible adverse effects of hynoptic and need for short term use only. Therefore, no additional refill. She verbalized understanding. Sent lunesta 2mg  0.5tab Qdprn. She was advised, there will be no additional refills F/up prn  Reviewed medical, surgical, and social history today  Medications: Outpatient Medications Prior to Visit  Medication Sig   albuterol (VENTOLIN HFA) 108 (90 Base) MCG/ACT inhaler Inhale 2 puffs into the lungs every 6 (six) hours as needed for wheezing or shortness of breath.    Ascorbic Acid (VITAMIN C PO) Take by mouth daily.   cetirizine (ZYRTEC) 10 MG tablet Take 1 tablet (10 mg total) by mouth daily.   Feverfew 380 MG CAPS Take by mouth.   MAGNESIUM PO Take by mouth.   TOSYMRA 10 MG/ACT SOLN USE 1 SPRAY AS NEEDED FOR MIGRAINE. MAY REPEAT ONCE AFTER 1 2 HOURS *USE PLATINUM PASS COUPON   triamcinolone (NASACORT) 55 MCG/ACT AERO nasal inhaler Place 2 sprays into the nose daily.   VITAMIN D PO Take 25 mcg by mouth daily.   No facility-administered medications prior to visit.   Reviewed past medical and social history.   ROS per HPI above  Last CBC Lab Results  Component Value Date   WBC 4.3 06/30/2021   HGB 12.4 06/30/2021   HCT 37.9 06/30/2021   MCV 93.0 06/30/2021   RDW 13.4 06/30/2021   PLT 189.0 06/30/2021   Last metabolic panel Lab Results  Component Value Date   GLUCOSE 85 07/05/2022   NA 140 07/05/2022   K 4.4 07/05/2022   CL 104 07/05/2022   CO2 27 07/05/2022   BUN 32 (H) 07/05/2022   CREATININE 0.83 07/05/2022   GFR 74.97 07/05/2022   CALCIUM 9.2 07/05/2022   PROT 6.8 07/05/2022   ALBUMIN 4.1 07/05/2022   BILITOT 0.4 07/05/2022   ALKPHOS 56 07/05/2022   AST 26 07/05/2022   ALT 23 07/05/2022   Last thyroid functions Lab Results  Component Value Date   TSH 1.48 06/30/2021      Objective:  BP  120/60 (BP Location: Left Arm, Patient Position: Sitting, Cuff Size: Normal)   Pulse (!) 56   Temp 98.5 F (36.9 C) (Temporal)   Resp 16   Ht 5\' 8"  (1.727 m)   Wt 149 lb 3.2 oz (67.7 kg)   SpO2 96%   BMI 22.69 kg/m      Physical Exam  No results found for any visits on 01/03/23.    Assessment & Plan:    Problem List Items Addressed This Visit       Cardiovascular and Mediastinum   Chronic migraine without aura without status migrainosus, not intractable    Managed by headache clinic in [place]. Uncontrolled Occurs daily triggers: lack of caffeine, poor sleep and stress Current treatment: abortive only: daily NSAIDs (aleve  and/or advil), caffeine, trigger point injections, and tosymra nasal spray prn. We discussed use of elavil which will help with sleep and migraine headache.  She declined use of any preventative med due to fear of possible side effects. Continue f/up with Headache clinic         Other   Adjustment insomnia - Primary    Interrupted sleep for several years, worse in last 6months due to increase stress at home. She is able to fall asleep but unable to stay asleep. Need for 1hr nap daily due to daytime fatigue, no snoring or apneic episodes Coffee 1.5cup daily, does not drink after 12noon. ALCOHOL: 2glass of wine per week Denies need for counseling. No improvement with advil Pm or magnesium glycinate. We discussed use of elavil vs trazodone vs restoril vs sonata vs lunesta. She declined use of elavil and trazodone due to fear of side effect-weight gain. Advised about possible adverse effects of hynoptic and need for short term use only. Therefore, no additional refill. She verbalized understanding. Sent lunesta 2mg  0.5tab Qdprn. She was advised, there will be no additional refills F/up prn      Relevant Medications   eszopiclone (LUNESTA) 2 MG TABS tablet   We dicussed 07/05/2022 CMP results compared to 07/08/22 CMP results. Advised BUN is normal. Advised to maintain adequate oral hydration.  Return if symptoms worsen or fail to improve.    Alysia Penna, NP

## 2023-01-03 NOTE — Patient Instructions (Signed)
Sonata or restoril or doxepin or trazodone.  Insomnia Insomnia is a sleep disorder that makes it difficult to fall asleep or stay asleep. Insomnia can cause fatigue, low energy, difficulty concentrating, mood swings, and poor performance at work or school. There are three different ways to classify insomnia: Difficulty falling asleep. Difficulty staying asleep. Waking up too early in the morning. Any type of insomnia can be long-term (chronic) or short-term (acute). Both are common. Short-term insomnia usually lasts for 3 months or less. Chronic insomnia occurs at least three times a week for longer than 3 months. What are the causes? Insomnia may be caused by another condition, situation, or substance, such as: Having certain mental health conditions, such as anxiety and depression. Using caffeine, alcohol, tobacco, or drugs. Having gastrointestinal conditions, such as gastroesophageal reflux disease (GERD). Having certain medical conditions. These include: Asthma. Alzheimer's disease. Stroke. Chronic pain. An overactive thyroid gland (hyperthyroidism). Other sleep disorders, such as restless legs syndrome and sleep apnea. Menopause. Sometimes, the cause of insomnia may not be known. What increases the risk? Risk factors for insomnia include: Gender. Females are affected more often than males. Age. Insomnia is more common as people get older. Stress and certain medical and mental health conditions. Lack of exercise. Having an irregular work schedule. This may include working night shifts and traveling between different time zones. What are the signs or symptoms? If you have insomnia, the main symptom is having trouble falling asleep or having trouble staying asleep. This may lead to other symptoms, such as: Feeling tired or having low energy. Feeling nervous about going to sleep. Not feeling rested in the morning. Having trouble concentrating. Feeling irritable, anxious, or  depressed. How is this diagnosed? This condition may be diagnosed based on: Your symptoms and medical history. Your health care provider may ask about: Your sleep habits. Any medical conditions you have. Your mental health. A physical exam. How is this treated? Treatment for insomnia depends on the cause. Treatment may focus on treating an underlying condition that is causing the insomnia. Treatment may also include: Medicines to help you sleep. Counseling or therapy. Lifestyle adjustments to help you sleep better. Follow these instructions at home: Eating and drinking  Limit or avoid alcohol, caffeinated beverages, and products that contain nicotine and tobacco, especially close to bedtime. These can disrupt your sleep. Do not eat a large meal or eat spicy foods right before bedtime. This can lead to digestive discomfort that can make it hard for you to sleep. Sleep habits  Keep a sleep diary to help you and your health care provider figure out what could be causing your insomnia. Write down: When you sleep. When you wake up during the night. How well you sleep and how rested you feel the next day. Any side effects of medicines you are taking. What you eat and drink. Make your bedroom a dark, comfortable place where it is easy to fall asleep. Put up shades or blackout curtains to block light from outside. Use a white noise machine to block noise. Keep the temperature cool. Limit screen use before bedtime. This includes: Not watching TV. Not using your smartphone, tablet, or computer. Stick to a routine that includes going to bed and waking up at the same times every day and night. This can help you fall asleep faster. Consider making a quiet activity, such as reading, part of your nighttime routine. Try to avoid taking naps during the day so that you sleep better at night. Get out  of bed if you are still awake after 15 minutes of trying to sleep. Keep the lights down, but try  reading or doing a quiet activity. When you feel sleepy, go back to bed. General instructions Take over-the-counter and prescription medicines only as told by your health care provider. Exercise regularly as told by your health care provider. However, avoid exercising in the hours right before bedtime. Use relaxation techniques to manage stress. Ask your health care provider to suggest some techniques that may work well for you. These may include: Breathing exercises. Routines to release muscle tension. Visualizing peaceful scenes. Make sure that you drive carefully. Do not drive if you feel very sleepy. Keep all follow-up visits. This is important. Contact a health care provider if: You are tired throughout the day. You have trouble in your daily routine due to sleepiness. You continue to have sleep problems, or your sleep problems get worse. Get help right away if: You have thoughts about hurting yourself or someone else. Get help right away if you feel like you may hurt yourself or others, or have thoughts about taking your own life. Go to your nearest emergency room or: Call 911. Call the National Suicide Prevention Lifeline at 3478708025 or 988. This is open 24 hours a day. Text the Crisis Text Line at 310 829 8445. Summary Insomnia is a sleep disorder that makes it difficult to fall asleep or stay asleep. Insomnia can be long-term (chronic) or short-term (acute). Treatment for insomnia depends on the cause. Treatment may focus on treating an underlying condition that is causing the insomnia. Keep a sleep diary to help you and your health care provider figure out what could be causing your insomnia. This information is not intended to replace advice given to you by your health care provider. Make sure you discuss any questions you have with your health care provider. Document Revised: 05/04/2021 Document Reviewed: 05/04/2021 Elsevier Patient Education  2024 [employer].

## 2023-07-01 ENCOUNTER — Telehealth (INDEPENDENT_AMBULATORY_CARE_PROVIDER_SITE_OTHER): Payer: 59 | Admitting: Internal Medicine

## 2023-07-01 ENCOUNTER — Ambulatory Visit: Payer: Self-pay | Admitting: Nurse Practitioner

## 2023-07-01 DIAGNOSIS — U071 COVID-19: Secondary | ICD-10-CM

## 2023-07-01 MED ORDER — NIRMATRELVIR/RITONAVIR (PAXLOVID)TABLET: 3.0000 | ORAL_TABLET | Freq: Two times a day (BID) | ORAL | 0 refills | Status: AC

## 2023-07-01 NOTE — Telephone Encounter (Signed)
Chief Complaint: Covid + Symptoms: Cough, congestion, headache, sore throat  Frequency: Constant, onset yesterday  Pertinent Negatives: Patient denies fever, chest pain, nausea, vomiting Disposition: [] ED /[] Urgent Care (no appt availability in office) / [x] Appointment(In office/virtual)/ []  [facility]/ [] Home Care/ [] Refused Recommended Disposition /[] [facility]/ []  Follow-up with PCP Additional Notes: Patient states she tested positive for Covid this morning and began having symptoms yesterday. Patient husband test positive last week. Patient requesting Rx for Paxlovid. Care advice was given and patient has been scheduled for a MyChart video visit  today with Dr. Lenord Burns for same day access.  Copied from CRM [id number]. Topic: Clinical - Red Word Triage >> Jul 01, 2023  9:23 AM Shelbie Proctor wrote: Red Word that prompted transfer to Nurse Triage: Patient [phone] tested positive for Covid and wants medication sent to [id number] - [place], [place] -  [address] [place] [address] Phone: [phone] Fax: [phone] Patient states has congestion, cough, tightness in chest, and has not taken her temperature. Patient's husband was positive for Covid and did well with Paxlovid. Reason for Disposition  [1] HIGH RISK patient AND [2] influenza is widespread in the community AND [3] ONE OR MORE respiratory symptoms: cough, sore throat, runny or stuffy nose  Answer Assessment - Initial Assessment Questions 1. COVID-19 DIAGNOSIS: "How do you know that you have COVID?" (e.g., positive lab test or self-test, diagnosed by doctor or NP/PA, symptoms after exposure).     Home test was positive , this morning  2. COVID-19 EXPOSURE: "Was there any known exposure to COVID before the symptoms began?" CDC Definition of close contact: within 6 feet (2 meters) for a total of 15 minutes or more over a 24-hour period.      Yes, my husband was positive about 1 week ago  3. ONSET: "When  did the COVID-19 symptoms start?"      Last night  4. WORST SYMPTOM: "What is your worst symptom?" (e.g., cough, fever, shortness of breath, muscle aches)     Cough and congestion  5. COUGH: "Do you have a cough?" If Yes, ask: "How bad is the cough?"       Yes, Moderate  6. FEVER: "Do you have a fever?" If Yes, ask: "What is your temperature, how was it measured, and when did it start?"     I'm not sure  7. RESPIRATORY STATUS: "Describe your breathing?" (e.g., normal; shortness of breath, wheezing, unable to speak)      Normal  8. BETTER-SAME-WORSE: "Are you getting better, staying the same or getting worse compared to yesterday?"  If getting worse, ask, "In what way?"     Worse  9. OTHER SYMPTOMS: "Do you have any other symptoms?"  (e.g., chills, fatigue, headache, loss of smell or taste, muscle pain, sore throat)     Chest congestion, cough, runny nose, headache, sore throat  10. HIGH RISK DISEASE: "Do you have any chronic medical problems?" (e.g., asthma, heart or lung disease, weak immune system, obesity, etc.)       I had asthma as a child  11. VACCINE: "Have you had the COVID-19 vaccine?" If Yes, ask: "Which one, how many shots, when did you get it?"       Yes, I had the vaccination and a booster  Protocols used: Coronavirus (COVID-19) Diagnosed or Suspected-A-AH

## 2023-07-01 NOTE — Progress Notes (Signed)
Patient Care Team: Nche, Bonna Gains, NP as PCP - General (Internal Medicine) Candice Camp, MD as Consulting Physician (Obstetrics and Gynecology)  I connected with Nancy Burns on 07/01/23 at 3:35 PM by video enabled telemedicine visit and verified that I am speaking with the correct person using two identifiers.   I discussed the limitations, risks, security and privacy concerns of performing an evaluation and management service by telemedicine and the availability of in-person appointments. I also discussed with the patient that there may be a patient responsible charge related to this service. The patient expressed understanding and agreed to proceed.   Other persons participating in the visit and their role in the encounter: Medical scribe, Larey Brick  Patient's location: Home  Provider's location: Clinic   I provided 15 minutes of face-to-face video visit time during this encounter, including medical decision making and e-scribing medication, and > 50% was spent counseling as documented under my assessment & plan.   Chief Complaint  Patient presents with   Covid Positive    Subjective:  Patient ID: Nancy Burns , Female    DOB: 17-Oct-1960, 63 y.o.    MRN: [id number]   63 y.o. Female presents today for sick visit with Covid-19. Patient has a past medical history of Mild Reactive Airways Disease, Bronchial Asthma, and hx of Covid-19 x2 - 07/2022 and 05/2021. Husband was Covid POS 1 week ago. Her symptoms began last night. At-home Covid-19 test was POS this morning. Was vaccinated for Covid-19 soon after vaccines were released, has not had updated booster recently. Endorses moderate cough, chest tightness, though not significant SOB currently, rhinorrhea, headache, sore throat and drainage.  Past Medical History:  Diagnosis Date   Asthma 06/2021   Bronchial Asthma   Migraines   Dr. Rana Snare is her GYN  Goes to [employer] for regular medical care.  Family History  Problem  Relation Age of Onset   Cancer Mother 48       lymphoma   Heart disease Mother    Arthritis Mother    Thyroid disease Mother 63       thyroidectomy   Diabetes Father    Heart disease Father    Hypertension Father    Stroke Father    Osteoporosis Sister    Hypertension Brother    Vision loss Brother    Social Hx: Married with 2 children. Nonsmoker. Social alcohol consumption.  Review of Systems  Constitutional:  Negative for fever and malaise/fatigue.  HENT:  Positive for congestion (& drainage) and sore throat.   Eyes:  Negative for blurred vision.  Respiratory:  Positive for cough and sputum production. Negative for shortness of breath.   Cardiovascular:  Negative for chest pain, palpitations and leg swelling.  Gastrointestinal:  Negative for vomiting.  Musculoskeletal:  Negative for back pain.  Skin:  Negative for rash.  Neurological:  Positive for headaches. Negative for loss of consciousness.    Objective:  Vitals: There were no vitals taken for this visit.  Physical Exam Vitals and nursing note reviewed.  Constitutional:      General: She is not in acute distress.    Appearance: Normal appearance. She is not toxic-appearing.  HENT:     Head: Normocephalic and atraumatic.  Pulmonary:     Effort: Pulmonary effort is normal.  Skin:    General: Skin is warm and dry.  Neurological:     Mental Status: She is alert and oriented to person, place, and time. Mental status is  at baseline.  Psychiatric:        Mood and Affect: Mood normal.        Behavior: Behavior normal.        Thought Content: Thought content normal.        Judgment: Judgment normal.    Results:  Studies obtained and personally reviewed by me: Labs:      Component Value Date/Time   NA 140 07/05/2022 0926   K 4.4 07/05/2022 0926   CL 104 07/05/2022 0926   CO2 27 07/05/2022 0926   GLUCOSE 85 07/05/2022 0926   BUN 32 (H) 07/05/2022 0926   CREATININE 0.83 07/05/2022 0926   CALCIUM 9.2 07/05/2022  0926   PROT 6.8 07/05/2022 0926   ALBUMIN 4.1 07/05/2022 0926   AST 26 07/05/2022 0926   ALT 23 07/05/2022 0926   ALKPHOS 56 07/05/2022 0926   BILITOT 0.4 07/05/2022 0926    Lab Results  Component Value Date   WBC 4.3 06/30/2021   HGB 12.4 06/30/2021   HCT 37.9 06/30/2021   MCV 93.0 06/30/2021   PLT 189.0 06/30/2021   Lab Results  Component Value Date   CHOL 186 07/05/2022   HDL 93.60 07/05/2022   LDLCALC 80 07/05/2022   TRIG 63.0 07/05/2022   CHOLHDL 2 07/05/2022   Lab Results  Component Value Date   TSH 1.48 06/30/2021     Assessment & Plan:  Covid-19: at home test was positive this morning. Patient is agreeable to taking Paxlovid. Patient GFR is 73.97 cc/minute; sending in 150-100 mg Paxlovid - take 3 tablets by mouth 2 (two) times daily for 5 days. (Take nirmatrelvir 150 mg two tablets twice daily for 5 days and ritonavir 100 mg one tablet twice daily for 5 days). Quarantine for 5 days. Can take Tylenol for fever/aches. Stay well rested and well hydrated. Walk around some when possible to prevent atelectasis. Contact us if symptoms worsen/persist despite treatment.   I,Emily Lagle,acting as a Neurosurgeon for Margaree Mackintosh, MD.,have documented all relevant documentation on the behalf of Margaree Mackintosh, MD,as directed by  Margaree Mackintosh, MD while in the presence of Margaree Mackintosh, MD.   I, Margaree Mackintosh, MD, have reviewed all documentation for this visit. The documentation on 07/03/23 for the exam, diagnosis, procedures, and orders are all accurate and complete.

## 2023-07-03 ENCOUNTER — Encounter: Payer: Self-pay | Admitting: Internal Medicine

## 2023-07-03 NOTE — Patient Instructions (Addendum)
We are sorry you are ill with COVID-19.  Have prescribed Paxlovid regular strength to take as directed.  Recommend quarantining for 5 days.  May take Tylenol for fever, headache or myalgia.  Please stay well-hydrated and walk some to prevent atelectasis.  It may be helpful to monitor pulse oximetry if you have a home pulse oximeter device.  Contact us if symptoms worsen or not improving within a few days.  Call us for shortness of breath on exertion, high fever or shaking chills.  You can be vaccinated for COVID-19 in a couple of months.  We also see that you have not had pneumococcal vaccine or Shingrix vaccines.  We also recommend a flu vaccine.

## 2023-07-11 ENCOUNTER — Ambulatory Visit (INDEPENDENT_AMBULATORY_CARE_PROVIDER_SITE_OTHER): Payer: 59 | Admitting: Nurse Practitioner

## 2023-07-11 ENCOUNTER — Encounter: Payer: Self-pay | Admitting: Nurse Practitioner

## 2023-07-11 VITALS — BP 102/70 | HR 56 | Temp 98.3°F | Ht 68.0 in | Wt 149.6 lb

## 2023-07-11 DIAGNOSIS — Z136 Encounter for screening for cardiovascular disorders: Secondary | ICD-10-CM | POA: Diagnosis not present

## 2023-07-11 DIAGNOSIS — Z780 Asymptomatic menopausal state: Secondary | ICD-10-CM | POA: Diagnosis not present

## 2023-07-11 DIAGNOSIS — Z0000 Encounter for general adult medical examination without abnormal findings: Secondary | ICD-10-CM | POA: Diagnosis not present

## 2023-07-11 DIAGNOSIS — Z13220 Encounter for screening for lipoid disorders: Secondary | ICD-10-CM | POA: Diagnosis not present

## 2023-07-11 LAB — COMPREHENSIVE METABOLIC PANEL
ALT: 33 U/L (ref 0–35)
AST: 31 U/L (ref 0–37)
Albumin: 4.5 g/dL (ref 3.5–5.2)
Alkaline Phosphatase: 62 U/L (ref 39–117)
BUN: 18 mg/dL (ref 6–23)
CO2: 28 meq/L (ref 19–32)
Calcium: 9.7 mg/dL (ref 8.4–10.5)
Chloride: 99 meq/L (ref 96–112)
Creatinine, Ser: 0.89 mg/dL (ref 0.40–1.20)
GFR: 68.45 mL/min (ref >60.00)
Glucose, Bld: 98 mg/dL (ref 70–99)
Potassium: 4.2 meq/L (ref 3.5–5.1)
Sodium: 136 meq/L (ref 135–145)
Total Bilirubin: 0.6 mg/dL (ref 0.2–1.2)
Total Protein: 7.7 g/dL (ref 6.0–8.3)

## 2023-07-11 LAB — TSH: TSH: 1.45 u[IU]/mL (ref 0.35–5.50)

## 2023-07-11 LAB — CBC WITH DIFFERENTIAL/PLATELET
Basophils Absolute: 0 10*3/uL (ref 0.0–0.1)
Basophils Relative: 0.7 % (ref 0.0–3.0)
Eosinophils Absolute: 0.2 10*3/uL (ref 0.0–0.7)
Eosinophils Relative: 3.7 % (ref 0.0–5.0)
HCT: 41 % (ref 36.0–46.0)
Hemoglobin: 13.7 g/dL (ref 12.0–15.0)
Lymphocytes Relative: 32.8 % (ref 12.0–46.0)
Lymphs Abs: 1.4 10*3/uL (ref 0.7–4.0)
MCHC: 33.5 g/dL (ref 30.0–36.0)
MCV: 94.4 fL (ref 78.0–100.0)
Monocytes Absolute: 0.5 10*3/uL (ref 0.1–1.0)
Monocytes Relative: 11.8 % (ref 3.0–12.0)
Neutro Abs: 2.2 10*3/uL (ref 1.4–7.7)
Neutrophils Relative %: 51 % (ref 43.0–77.0)
Platelets: 172 10*3/uL (ref 150.0–400.0)
RBC: 4.34 Mil/uL (ref 3.87–5.11)
RDW: 14.2 % (ref 11.5–15.5)
WBC: 4.4 10*3/uL (ref 4.0–10.5)

## 2023-07-11 LAB — LIPID PANEL
Cholesterol: 191 mg/dL (ref 0–200)
HDL: 101 mg/dL (ref >39.00)
LDL Cholesterol: 71 mg/dL (ref 0–99)
NonHDL: 89.58
Total CHOL/HDL Ratio: 2
Triglycerides: 92 mg/dL (ref 0.0–149.0)
VLDL: 18.4 mg/dL (ref 0.0–40.0)

## 2023-07-11 NOTE — Progress Notes (Signed)
Complete physical exam  Patient: Nancy Burns   DOB: [birth year]   63 y.o. Female  MRN: [id number] Visit Date: 07/11/2023  Subjective:    Chief Complaint  Patient presents with   Annual Exam    With fasting lab work, no concerns   Nancy Burns is a 63 y.o. female who presents today for a complete physical exam. She reports consuming a general diet.  Walking daily  She generally feels well. She reports sleeping well. She does not have additional problems to discuss today.  Vision:No Dental:Yes STD Screen:No  BP Readings from Last 3 Encounters:  07/11/23 102/70  01/03/23 120/60  07/05/22 106/60   Wt Readings from Last 3 Encounters:  07/11/23 149 lb 9.6 oz (67.9 kg)  01/03/23 149 lb 3.2 oz (67.7 kg)  07/05/22 146 lb 6.4 oz (66.4 kg)   Most recent fall risk assessment:    07/11/2023   10:13 AM  Fall Risk   Falls in the past year? 0  Number falls in past yr: 0  Injury with Fall? 0  Risk for fall due to : No Fall Risks  Follow up Falls evaluation completed   Depression screen:Yes - No Depression  Most recent depression screenings:    07/11/2023   10:13 AM 07/05/2022    9:17 AM  PHQ 2/9 Scores  PHQ - 2 Score 0 0  PHQ- 9 Score 0 0   HPI  No problem-specific Assessment & Plan notes found for this encounter.  Past Medical History:  Diagnosis Date   Asthma 06/2021   Bronchial Asthma   Migraines    No past surgical history on file. Social History   Socioeconomic History   Marital status: Married    Spouse name: Not on file   Number of children: 2   Years of education: Not on file   Highest education level: Bachelor's degree (e.g., BA, AB, BS)  Occupational History   Not on file  Tobacco Use   Smoking status: Never   Smokeless tobacco: Never  Vaping Use   Vaping status: Never Used  Substance and Sexual Activity   Alcohol use: Not Currently    Comment: social   Drug use: No   Sexual activity: Yes    Birth control/protection: Post-menopausal     Comment: menopause at age 24  Other Topics Concern   Not on file  Social History Narrative   Not on file   Social Drivers of Health   Financial Resource Strain: Low Risk  (07/01/2023)   Overall Financial Resource Strain (CARDIA)    Difficulty of Paying Living Expenses: Not hard at all  Food Insecurity: No Food Insecurity (07/01/2023)   Hunger Vital Sign    Worried About Running Out of Food in the Last Year: Never true    Ran Out of Food in the Last Year: Never true  Transportation Needs: No Transportation Needs (07/01/2023)   PRAPARE - Administrator, Civil Service (Medical): No    Lack of Transportation (Non-Medical): No  Physical Activity: Insufficiently Active (07/01/2023)   Exercise Vital Sign    Days of Exercise per Week: 3 days    Minutes of Exercise per Session: 40 min  Stress: No Stress Concern Present (07/01/2023)   [employer] of Occupational Health - Occupational Stress Questionnaire    Feeling of Stress : Only a little  Social Connections: Socially Integrated (07/01/2023)   Social Connection and Isolation Panel [NHANES]    Frequency of Communication with Friends  and Family: More than three times a week    Frequency of Social Gatherings with Friends and Family: More than three times a week    Attends Religious Services: More than 4 times per year    Active Member of [employer] or Organizations: Yes    Attends Banker Meetings: More than 4 times per year    Marital Status: Married  Catering manager Violence: Unknown (09/10/2021)   Received from [employer], [facility]   HITS    Physically Hurt: Not on file    Insult or Talk Down To: Not on file    Threaten Physical Harm: Not on file    Scream or Curse: Not on file   Family Status  Relation Name Status   Mother Constance Haw Deceased   Father Lenice Pressman Deceased   Sister  Alive   Brother Sharol Harness Alive   Daughter  Alive   Son  Alive   MGM  Deceased   MGF   Alive   PGM  Deceased   PGF  Deceased  No partnership data on file   Family History  Problem Relation Age of Onset   Cancer Mother 42       lymphoma   Heart disease Mother    Arthritis Mother    Thyroid disease Mother 51       thyroidectomy   Diabetes Father    Heart disease Father    Hypertension Father    Stroke Father    Osteoporosis Sister    Hypertension Brother    Vision loss Brother    No Known Allergies  Patient Care Team: Maclin Guerrette, Bonna Gains, NP as PCP - General (Internal Medicine) Candice Camp, MD as Consulting Physician (Obstetrics and Gynecology)   Medications: Outpatient Medications Prior to Visit  Medication Sig   Feverfew 380 MG CAPS Take by mouth.   MAGNESIUM PO Take by mouth.   UBRELVY 100 MG TABS Take 100 mg by mouth as needed.   VITAMIN D PO Take 25 mcg by mouth daily.   albuterol (VENTOLIN HFA) 108 (90 Base) MCG/ACT inhaler Inhale 2 puffs into the lungs every 6 (six) hours as needed for wheezing or shortness of breath.   Ascorbic Acid (VITAMIN C PO) Take by mouth daily. (Patient not taking: Reported on 07/11/2023)   baclofen (LIORESAL) 10 MG tablet Take 10 mg by mouth as needed.   cetirizine (ZYRTEC) 10 MG tablet Take 1 tablet (10 mg total) by mouth daily. (Patient not taking: Reported on 07/11/2023)   eszopiclone (LUNESTA) 2 MG TABS tablet Take 1 tablet (2 mg total) by mouth at bedtime as needed for sleep. Take immediately before bedtime (Patient not taking: Reported on 07/11/2023)   TOSYMRA 10 MG/ACT SOLN USE 1 SPRAY AS NEEDED FOR MIGRAINE. MAY REPEAT ONCE AFTER 1 2 HOURS *USE PLATINUM PASS COUPON (Patient not taking: Reported on 07/11/2023)   triamcinolone (NASACORT) 55 MCG/ACT AERO nasal inhaler Place 2 sprays into the nose daily. (Patient not taking: Reported on 07/11/2023)   No facility-administered medications prior to visit.   Review of Systems  Constitutional:  Negative for activity change, appetite change and unexpected weight change.  Respiratory:  Negative.    Cardiovascular: Negative.   Gastrointestinal: Negative.   Endocrine: Negative for cold intolerance and heat intolerance.  Genitourinary: Negative.   Musculoskeletal: Negative.   Skin: Negative.   Neurological: Negative.   Hematological: Negative.   Psychiatric/Behavioral:  Negative for behavioral problems, decreased concentration, dysphoric mood, hallucinations, self-injury,  sleep disturbance and suicidal ideas. The patient is not nervous/anxious.        Objective:  BP 102/70 (BP Location: Right Arm, Patient Position: Sitting, Cuff Size: Small)   Pulse (!) 56   Temp 98.3 F (36.8 C)   Ht 5\' 8"  (1.727 m)   Wt 149 lb 9.6 oz (67.9 kg)   SpO2 98%   BMI 22.75 kg/m     Physical Exam Vitals and nursing note reviewed.  Constitutional:      General: She is not in acute distress. HENT:     Right Ear: Tympanic membrane, ear canal and external ear normal.     Left Ear: Tympanic membrane, ear canal and external ear normal.     Nose: Nose normal.  Eyes:     Extraocular Movements: Extraocular movements intact.     Conjunctiva/sclera: Conjunctivae normal.     Pupils: Pupils are equal, round, and reactive to light.  Neck:     Thyroid: No thyroid mass, thyromegaly or thyroid tenderness.  Cardiovascular:     Rate and Rhythm: Normal rate and regular rhythm.     Pulses: Normal pulses.     Heart sounds: Normal heart sounds.  Pulmonary:     Effort: Pulmonary effort is normal.     Breath sounds: Normal breath sounds.  Abdominal:     General: Bowel sounds are normal.     Palpations: Abdomen is soft.  Musculoskeletal:        General: Normal range of motion.     Cervical back: Normal range of motion and neck supple.     Right lower leg: No edema.     Left lower leg: No edema.  Lymphadenopathy:     Cervical: No cervical adenopathy.  Skin:    General: Skin is warm and dry.  Neurological:     Mental Status: She is alert and oriented to person, place, and time.     Cranial  Nerves: No cranial nerve deficit.  Psychiatric:        Mood and Affect: Mood normal.        Behavior: Behavior normal.        Thought Content: Thought content normal.     No results found for any visits on 07/11/23.    Assessment & Plan:    Routine Health Maintenance and Physical Exam  Immunization History  Administered Date(s) Administered   Influenza,inj,Quad PF,6+ Mos 02/26/2019   Influenza-Unspecified 05/15/2021   MMR 07/07/1987   PPD Test 06/14/2017   Tdap 06/30/2021   Zoster Recombinant(Shingrix) 12/26/2020, 03/31/2021   Health Maintenance  Topic Date Due   HIV Screening  Never done   COVID-19 Vaccine (1 - 2024-25 season) 07/27/2023 (Originally 02/06/2023)   INFLUENZA VACCINE  09/05/2023 (Originally 01/06/2023)   Pneumococcal Vaccine 25-41 Years old (1 of 2 - PCV) 07/10/2024 (Originally 12/21/1966)   MAMMOGRAM  10/29/2023   Cervical Cancer Screening (HPV/Pap Cotest)  07/13/2024   Fecal DNA (Cologuard)  02/18/2025   DTaP/Tdap/Td (2 - Td or Tdap) 07/01/2031   Hepatitis C Screening  Completed   Zoster Vaccines- Shingrix  Completed   HPV VACCINES  Aged Out   Discussed health benefits of physical activity, and encouraged her to engage in regular exercise appropriate for her age and condition.  Problem List Items Addressed This Visit   None Visit Diagnoses       Preventative health care    -  Primary   Relevant Orders   CBC with Differential/Platelet   Comprehensive metabolic panel  TSH   Lipid panel     Asymptomatic postmenopausal estrogen deficiency       Relevant Orders   DG Bone Density     Encounter for lipid screening for cardiovascular disease       Relevant Orders   Lipid panel      Return in about 1 year (around 07/10/2024) for CPE (fasting).     Alysia Penna, NP

## 2023-07-11 NOTE — Patient Instructions (Signed)
 Go to lab Maintain Heart healthy diet and daily exercise. Maintain current medications.  Preventive Care 64-65 Years Old, Female Preventive care refers to lifestyle choices and visits with your health care provider that can promote health and wellness. Preventive care visits are also called wellness exams. What can I expect for my preventive care visit? Counseling Your health care provider may ask you questions about your: Medical history, including: Past medical problems. Family medical history. Pregnancy history. Current health, including: Menstrual cycle. Method of birth control. Emotional well-being. Home life and relationship well-being. Sexual activity and sexual health. Lifestyle, including: Alcohol, nicotine or tobacco, and drug use. Access to firearms. Diet, exercise, and sleep habits. Work and work Astronomer. Sunscreen use. Safety issues such as seatbelt and bike helmet use. Physical exam Your health care provider will check your: Height and weight. These may be used to calculate your BMI (body mass index). BMI is a measurement that tells if you are at a healthy weight. Waist circumference. This measures the distance around your waistline. This measurement also tells if you are at a healthy weight and may help predict your risk of certain diseases, such as type 2 diabetes and high blood pressure. Heart rate and blood pressure. Body temperature. Skin for abnormal spots. What immunizations do I need?  Vaccines are usually given at various ages, according to a schedule. Your health care provider will recommend vaccines for you based on your age, medical history, and lifestyle or other factors, such as travel or where you work. What tests do I need? Screening Your health care provider may recommend screening tests for certain conditions. This may include: Lipid and cholesterol levels. Diabetes screening. This is done by checking your blood sugar (glucose) after you  have not eaten for a while (fasting). Pelvic exam and Pap test. Hepatitis B test. Hepatitis C test. HIV (human immunodeficiency virus) test. STI (sexually transmitted infection) testing, if you are at risk. Lung cancer screening. Colorectal cancer screening. Mammogram. Talk with your health care provider about when you should start having regular mammograms. This may depend on whether you have a family history of breast cancer. BRCA-related cancer screening. This may be done if you have a family history of breast, ovarian, tubal, or peritoneal cancers. Bone density scan. This is done to screen for osteoporosis. Talk with your health care provider about your test results, treatment options, and if necessary, the need for more tests. Follow these instructions at home: Eating and drinking  Eat a diet that includes fresh fruits and vegetables, whole grains, lean protein, and low-fat dairy products. Take vitamin and mineral supplements as recommended by your health care provider. Do not drink alcohol if: Your health care provider tells you not to drink. You are pregnant, may be pregnant, or are planning to become pregnant. If you drink alcohol: Limit how much you have to 0-1 drink a day. Know how much alcohol is in your drink. In the [place] one drink equals one 12 oz bottle of beer (355 mL), one 5 oz glass of wine (148 mL), or one 1 oz glass of hard liquor (44 mL). Lifestyle Brush your teeth every morning and night with fluoride toothpaste. Floss one time each day. Exercise for at least 30 minutes 5 or more days each week. Do not use any products that contain nicotine or tobacco. These products include cigarettes, chewing tobacco, and vaping devices, such as e-cigarettes. If you need help quitting, ask your health care provider. Do not use drugs. If you are  sexually active, practice safe sex. Use a condom or other form of protection to prevent STIs. If you do not wish to become pregnant, use  a form of birth control. If you plan to become pregnant, see your health care provider for a prepregnancy visit. Take aspirin only as told by your health care provider. Make sure that you understand how much to take and what form to take. Work with your health care provider to find out whether it is safe and beneficial for you to take aspirin daily. Find healthy ways to manage stress, such as: Meditation, yoga, or listening to music. Journaling. Talking to a trusted person. Spending time with friends and family. Minimize exposure to UV radiation to reduce your risk of skin cancer. Safety Always wear your seat belt while driving or riding in a vehicle. Do not drive: If you have been drinking alcohol. Do not ride with someone who has been drinking. When you are tired or distracted. While texting. If you have been using any mind-altering substances or drugs. Wear a helmet and other protective equipment during sports activities. If you have firearms in your house, make sure you follow all gun safety procedures. Seek help if you have been physically or sexually abused. What's next? Visit your health care provider once a year for an annual wellness visit. Ask your health care provider how often you should have your eyes and teeth checked. Stay up to date on all vaccines. This information is not intended to replace advice given to you by your health care provider. Make sure you discuss any questions you have with your health care provider. Document Revised: 11/19/2020 Document Reviewed: 11/19/2020 Elsevier Patient Education  2024 [employer].

## 2023-07-12 ENCOUNTER — Encounter: Payer: Self-pay | Admitting: Nurse Practitioner

## 2023-07-16 ENCOUNTER — Other Ambulatory Visit: Payer: Self-pay

## 2023-07-16 ENCOUNTER — Ambulatory Visit
Admission: EM | Admit: 2023-07-16 | Discharge: 2023-07-16 | Disposition: A | Discharge status: Home / Self Care | Payer: 59 | Attending: Family Medicine | Admitting: Family Medicine

## 2023-07-16 DIAGNOSIS — Z87440 Personal history of urinary (tract) infections: Secondary | ICD-10-CM | POA: Insufficient documentation

## 2023-07-16 DIAGNOSIS — R319 Hematuria, unspecified: Secondary | ICD-10-CM | POA: Diagnosis not present

## 2023-07-16 DIAGNOSIS — R300 Dysuria: Secondary | ICD-10-CM | POA: Diagnosis present

## 2023-07-16 DIAGNOSIS — R350 Frequency of micturition: Secondary | ICD-10-CM | POA: Diagnosis not present

## 2023-07-16 DIAGNOSIS — R3915 Urgency of urination: Secondary | ICD-10-CM | POA: Diagnosis not present

## 2023-07-16 DIAGNOSIS — N390 Urinary tract infection, site not specified: Secondary | ICD-10-CM | POA: Diagnosis not present

## 2023-07-16 LAB — POCT URINALYSIS DIP (MANUAL ENTRY)
Bilirubin, UA: NEGATIVE
Glucose, UA: NEGATIVE mg/dL
Ketones, POC UA: NEGATIVE mg/dL
Nitrite, UA: NEGATIVE
Protein Ur, POC: 100 mg/dL — AB
Spec Grav, UA: 1.02 (ref 1.010–1.025)
Urobilinogen, UA: 0.2 U/dL
pH, UA: 5.5 (ref 5.0–8.0)

## 2023-07-16 MED ORDER — NITROFURANTOIN MONOHYD MACRO 100 MG PO CAPS: 100.0000 mg | ORAL_CAPSULE | Freq: Two times a day (BID) | ORAL | 0 refills | Status: AC

## 2023-07-16 NOTE — ED Triage Notes (Signed)
 Pt presents to urgent care with burning urination x 1 day. Denies taking OTC medication for symptoms. Pt currently rates her pain as a 2/10 although when urinating pain increases to an 8/10.

## 2023-07-16 NOTE — ED Provider Notes (Signed)
 [facility]    CSN: [id number] Arrival date & time: 07/16/23  1115      History   Chief Complaint No chief complaint on file.   HPI Nancy Burns is a 63 y.o. female.   HPI Suspected urinary tract infection symptom onset last p.m., symptoms include dysuria, frequency, urgency, hematuria, lower abdominal pressure.  Has had UTIs in the past but not for a long time.  Denies fever, chills, sweats, nausea, vomiting, back pain, diarrhea, vaginal discharge, concern for STI.  No known drug allergies.  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  OB History   No obstetric history on file.      Home Medications    Prior to Admission medications   Medication Sig Start Date End Date Taking? Authorizing Provider  albuterol  (VENTOLIN  HFA) 108 (90 Base) MCG/ACT inhaler Inhale 2 puffs into the lungs every 6 (six) hours as needed for wheezing or shortness of breath. 05/26/22   Nche, Roselie Rockford, NP  Ascorbic Acid (VITAMIN C PO) Take by mouth daily. Patient not taking: Reported on 07/11/2023    [provider]  baclofen (LIORESAL) 10 MG tablet Take 10 mg by mouth as needed.    [provider]  cetirizine  (ZYRTEC ) 10 MG tablet Take 1 tablet (10 mg total) by mouth daily. Patient not taking: Reported on 07/11/2023 05/26/22   Nche, Roselie Rockford, NP  eszopiclone  (LUNESTA ) 2 MG TABS tablet Take 1 tablet (2 mg total) by mouth at bedtime as needed for sleep. Take immediately before bedtime Patient not taking: Reported on 07/11/2023 01/03/23   Nche, Roselie Rockford, NP  Feverfew 380 MG  CAPS Take by mouth.    [provider]  MAGNESIUM PO Take by mouth.    [provider]  TOSYMRA 10 MG/ACT SOLN USE 1 SPRAY AS NEEDED FOR MIGRAINE. MAY REPEAT ONCE AFTER 1 2 HOURS *USE PLATINUM PASS COUPON Patient not taking: Reported on 07/11/2023 12/27/18   [provider]  triamcinolone  (NASACORT ) 55 MCG/ACT AERO nasal inhaler Place 2 sprays into the nose daily. Patient not taking: Reported on 07/11/2023 05/26/22   Nche, Roselie Rockford, NP  UBRELVY 100 MG TABS Take 100 mg by mouth as needed. 06/23/23   [provider]  VITAMIN D PO Take 25 mcg by mouth daily.    [provider]    Family History Family History  Problem Relation Age of Onset   Cancer Mother 26       lymphoma   Heart disease Mother    Arthritis Mother    Thyroid  disease Mother 64       thyroidectomy   Diabetes Father    Heart disease Father    Hypertension Father    Stroke Father    Osteoporosis Sister    Hypertension Brother    Vision loss Brother     Social History Social History   Tobacco Use   Smoking status: Never   Smokeless tobacco: Never  Vaping Use   Vaping status: Never Used  Substance Use Topics   Alcohol use: Not  Currently    Comment: social   Drug use: No     Allergies   Patient has no known allergies.   Review of Systems Review of Systems   Physical Exam Triage Vital Signs ED Triage Vitals [07/16/23 1120]  Encounter Vitals Group     BP 123/75     Systolic BP Percentile      Diastolic BP Percentile      Pulse Rate 60     Resp 16     Temp 97.9 F (36.6 C)     Temp Source Oral     SpO2 98 %     Weight      Height      Head Circumference      Peak Flow      Pain Score      Pain Loc      Pain Education      Exclude from Growth Chart    No data found.  Updated Vital Signs BP 123/75 (BP Location: Right Arm)   Pulse 60   Temp 97.9 F (36.6 C) (Oral)   Resp 16   SpO2 98%   Visual Acuity Right Eye Distance:   Left Eye  Distance:   Bilateral Distance:    Right Eye Near:   Left Eye Near:    Bilateral Near:     Physical Exam Constitutional:      Appearance: She is not ill-appearing.  HENT:     Head: Normocephalic.     Right Ear: Tympanic membrane and ear canal normal.     Left Ear: Tympanic membrane and ear canal normal.     Mouth/Throat:     Mouth: Mucous membranes are moist.     Pharynx: Oropharynx is clear.  Cardiovascular:     Rate and Rhythm: Normal rate and regular rhythm.     Heart sounds: Normal heart sounds.  Pulmonary:     Effort: Pulmonary effort is normal.     Breath sounds: Normal breath sounds.  Abdominal:     General: Bowel sounds are normal.     Palpations: Abdomen is soft.     Tenderness: There is abdominal tenderness (Mild suprapubic no rebound or guarding). There is no right CVA tenderness or left CVA tenderness.  Neurological:     Mental Status: She is alert and oriented to person, place, and time.      UC Treatments / Results  Labs (all labs ordered are listed, but only abnormal results are displayed) Labs Reviewed  POCT URINALYSIS DIP (MANUAL ENTRY)    EKG   Radiology No results found.  Procedures Procedures (including critical care time)  Medications Ordered in UC Medications - No data to display  Initial Impression / Assessment and Plan / UC Course  I have reviewed the triage vital signs and the nursing notes.  Pertinent labs & imaging results that were available during my care of the patient were reviewed by me and considered in my medical decision making (see chart for details).     63 year old with UTI symptoms since last p.m., well-appearing, afebrile, vital signs stable.  Mild suprapubic tenderness on exam.  Point-of-care urinalysis is cloudy with large blood and small leuks, will treat for UTI with Macrobid  pending results of urine culture.  Warning signs and follow-up reviewed with patient Final Clinical Impressions(s) / UC Diagnoses   Final  diagnoses:  Dysuria   Discharge Instructions   None    ED Prescriptions   None    PDMP not  reviewed this encounter.   [place] Kappes, [place] 07/16/23 1132

## 2023-07-16 NOTE — Discharge Instructions (Addendum)
 Follow-up if your symptoms fail to improve Seek medical attention for new symptoms such as fever, vomiting, back pain

## 2023-07-17 ENCOUNTER — Ambulatory Visit: Payer: Self-pay

## 2023-07-18 LAB — URINE CULTURE: Culture: 20000 — AB

## 2024-01-25 ENCOUNTER — Other Ambulatory Visit (HOSPITAL_BASED_OUTPATIENT_CLINIC_OR_DEPARTMENT_OTHER)

## 2024-02-29 ENCOUNTER — Other Ambulatory Visit: Payer: 59

## 2024-07-16 ENCOUNTER — Encounter: Payer: 59 | Admitting: Nurse Practitioner

## 2024-08-15 ENCOUNTER — Other Ambulatory Visit (HOSPITAL_BASED_OUTPATIENT_CLINIC_OR_DEPARTMENT_OTHER)
